# Patient Record
Sex: Male | Born: 1980 | Race: White | Hispanic: No | Marital: Married | Smoking: Never smoker
Health system: Southern US, Community
[De-identification: ages and names within clinical notes are randomized; demographics above are authoritative.]

## PROBLEM LIST (undated history)

## (undated) DIAGNOSIS — E669 Obesity, unspecified: Secondary | ICD-10-CM

## (undated) DIAGNOSIS — Z860100 Personal history of colon polyps, unspecified: Secondary | ICD-10-CM

## (undated) DIAGNOSIS — K769 Liver disease, unspecified: Secondary | ICD-10-CM

## (undated) DIAGNOSIS — D649 Anemia, unspecified: Secondary | ICD-10-CM

## (undated) DIAGNOSIS — Z8371 Family history of colonic polyps: Secondary | ICD-10-CM

## (undated) DIAGNOSIS — Z83719 Family history of colon polyps, unspecified: Secondary | ICD-10-CM

## (undated) DIAGNOSIS — J45991 Cough variant asthma: Secondary | ICD-10-CM

## (undated) HISTORY — DX: Personal history of colon polyps, unspecified: Z86.0100

## (undated) HISTORY — PX: APPENDECTOMY: SHX54

## (undated) HISTORY — DX: Family history of colon polyps, unspecified: Z83.719

## (undated) HISTORY — DX: Liver disease, unspecified: K76.9

## (undated) HISTORY — PX: HERNIA REPAIR: SHX51

## (undated) HISTORY — DX: Anemia, unspecified: D64.9

## (undated) HISTORY — DX: Personal history of colonic polyps: Z86.010

## (undated) HISTORY — DX: Family history of colonic polyps: Z83.71

---

## 2014-05-02 ENCOUNTER — Ambulatory Visit: Payer: Self-pay | Admitting: Surgery

## 2015-04-12 NOTE — Op Note (Signed)
PATIENT NAME:  Charles Jensen, Charles Jensen MR#:  [id number] DATE OF BIRTH:  [birth year]  DATE OF PROCEDURE:  05/02/2014  PREOPERATIVE DIAGNOSIS:  Ventral hernia.   POSTOPERATIVE DIAGNOSIS:  Ventral hernia.   PROCEDURE:  Ventral hernia repair.   SURGEON:  Loreli Dollar, M.D.   ANESTHESIA:  General.   INDICATIONS:  This 34 year old male has a history of bulging in the epigastrium. A ventral hernia was demonstrated and repair was recommended for definitive treatment.   DESCRIPTION OF PROCEDURE:  The patient was placed on the operating table in the supine position under general anesthesia. The abdomen was prepared with ChloraPrep and draped in a sterile manner.   A longitudinally-oriented incision was made in the epigastrium approximately 3 cm in length so that the lower end of the incision was just above the umbilicus. The incision was carried down through a thin layer of subcutaneous tissues to encounter incarcerated hernia consisting of fatty tissue. The sac was dissected free from the surrounding structures and then dissected away from a fascial ring defect. The fatty tissue within the hernia was incarcerated. It was necessary to lengthen the opening in the fascia in a cephalad direction by approximately 8 mm. With some additional dissection and manipulation, the fatty tissues were reduced back into the peritoneal cavity. The fascial edges were identified and properitoneal fat was pushed away from the fascia circumferentially around the defect. Next, a Bard soft mesh was cut to create an oval shape of some 2 x 3 cm and this was placed into the properitoneal plane and sutured to the overlying fascia above and below the defect and also on each side with through-and-through 0 Surgilon sutures so that the spread the mesh out beneath the defect. Next, the defect was closed with a longitudinally-oriented suture line of interrupted 0 Surgilon figure-of-eight sutures incorporating each suture into the mesh. The  repair looked good. It is noted that during the course of the procedure, a few small bleeding points were cauterized. Hemostasis was subsequently intact. Next, the subcutaneous tissues were approximated with 4-0 Monocryl. The skin was closed with a running 4-0 Monocryl subcuticular suture and Dermabond. The patient tolerated surgery satisfactorily and was then prepared for transfer to the recovery room.  ____________________________ Lenna Sciara. Rochel Brome, MD [email or web address] D: 05/02/2014 11:27:10 ET T: 05/02/2014 11:48:52 ET JOB#: [id number]  cc: Loreli Dollar, MD, <Dictator> Loreli Dollar MD ELECTRONICALLY SIGNED 05/06/2014 21:07

## 2016-02-18 ENCOUNTER — Encounter: Payer: Self-pay | Admitting: Emergency Medicine

## 2016-02-18 ENCOUNTER — Observation Stay
Admission: EM | Admit: 2016-02-18 | Discharge: 2016-02-20 | Disposition: A | Discharge status: Home / Self Care | Payer: 59 | Attending: Specialist | Admitting: Specialist

## 2016-02-18 DIAGNOSIS — J45991 Cough variant asthma: Secondary | ICD-10-CM | POA: Diagnosis present

## 2016-02-18 DIAGNOSIS — Z9049 Acquired absence of other specified parts of digestive tract: Secondary | ICD-10-CM | POA: Insufficient documentation

## 2016-02-18 DIAGNOSIS — R0602 Shortness of breath: Secondary | ICD-10-CM | POA: Insufficient documentation

## 2016-02-18 DIAGNOSIS — Z791 Long term (current) use of non-steroidal anti-inflammatories (NSAID): Secondary | ICD-10-CM | POA: Insufficient documentation

## 2016-02-18 DIAGNOSIS — Z8249 Family history of ischemic heart disease and other diseases of the circulatory system: Secondary | ICD-10-CM | POA: Insufficient documentation

## 2016-02-18 DIAGNOSIS — D508 Other iron deficiency anemias: Secondary | ICD-10-CM

## 2016-02-18 DIAGNOSIS — D649 Anemia, unspecified: Secondary | ICD-10-CM | POA: Diagnosis present

## 2016-02-18 DIAGNOSIS — J45909 Unspecified asthma, uncomplicated: Secondary | ICD-10-CM | POA: Insufficient documentation

## 2016-02-18 DIAGNOSIS — R42 Dizziness and giddiness: Secondary | ICD-10-CM | POA: Diagnosis not present

## 2016-02-18 DIAGNOSIS — K769 Liver disease, unspecified: Secondary | ICD-10-CM | POA: Diagnosis not present

## 2016-02-18 DIAGNOSIS — R918 Other nonspecific abnormal finding of lung field: Secondary | ICD-10-CM | POA: Insufficient documentation

## 2016-02-18 DIAGNOSIS — R05 Cough: Secondary | ICD-10-CM | POA: Insufficient documentation

## 2016-02-18 DIAGNOSIS — Z79899 Other long term (current) drug therapy: Secondary | ICD-10-CM | POA: Insufficient documentation

## 2016-02-18 DIAGNOSIS — E669 Obesity, unspecified: Secondary | ICD-10-CM | POA: Insufficient documentation

## 2016-02-18 DIAGNOSIS — Z9889 Other specified postprocedural states: Secondary | ICD-10-CM | POA: Diagnosis not present

## 2016-02-18 DIAGNOSIS — R053 Chronic cough: Secondary | ICD-10-CM

## 2016-02-18 DIAGNOSIS — Z6832 Body mass index (BMI) 32.0-32.9, adult: Secondary | ICD-10-CM | POA: Diagnosis not present

## 2016-02-18 DIAGNOSIS — K219 Gastro-esophageal reflux disease without esophagitis: Secondary | ICD-10-CM | POA: Diagnosis not present

## 2016-02-18 DIAGNOSIS — R160 Hepatomegaly, not elsewhere classified: Secondary | ICD-10-CM

## 2016-02-18 DIAGNOSIS — D1803 Hemangioma of intra-abdominal structures: Secondary | ICD-10-CM | POA: Diagnosis not present

## 2016-02-18 DIAGNOSIS — D509 Iron deficiency anemia, unspecified: Principal | ICD-10-CM | POA: Insufficient documentation

## 2016-02-18 HISTORY — DX: Cough variant asthma: J45.991

## 2016-02-18 HISTORY — DX: Obesity, unspecified: E66.9

## 2016-02-18 LAB — COMPREHENSIVE METABOLIC PANEL
ALT: 13 U/L — AB (ref 17–63)
AST: 16 U/L (ref 15–41)
Albumin: 4.3 g/dL (ref 3.5–5.0)
Alkaline Phosphatase: 52 U/L (ref 38–126)
Anion gap: 4 — ABNORMAL LOW (ref 5–15)
BILIRUBIN TOTAL: 0.8 mg/dL (ref 0.3–1.2)
BUN: 10 mg/dL (ref 6–20)
CALCIUM: 8.5 mg/dL — AB (ref 8.9–10.3)
CHLORIDE: 108 mmol/L (ref 101–111)
CO2: 25 mmol/L (ref 22–32)
CREATININE: 0.72 mg/dL (ref 0.61–1.24)
Glucose, Bld: 93 mg/dL (ref 65–99)
Potassium: 3.8 mmol/L (ref 3.5–5.1)
Sodium: 137 mmol/L (ref 135–145)
TOTAL PROTEIN: 7.1 g/dL (ref 6.5–8.1)

## 2016-02-18 LAB — CBC
HCT: 22.9 % — ABNORMAL LOW (ref 40.0–52.0)
Hemoglobin: 5.9 g/dL — ABNORMAL LOW (ref 13.0–18.0)
MCH: 13.5 pg — ABNORMAL LOW (ref 26.0–34.0)
MCHC: 25.6 g/dL — ABNORMAL LOW (ref 32.0–36.0)
MCV: 52.5 fL — AB (ref 80.0–100.0)
PLATELETS: 250 10*3/uL (ref 150–440)
RBC: 4.37 MIL/uL — AB (ref 4.40–5.90)
RDW: 23.8 % — AB (ref 11.5–14.5)
WBC: 6.4 10*3/uL (ref 3.8–10.6)

## 2016-02-18 LAB — FERRITIN: FERRITIN: 2 ng/mL — AB (ref 24–336)

## 2016-02-18 LAB — IRON AND TIBC
Iron: 10 ug/dL — ABNORMAL LOW (ref 45–182)
Saturation Ratios: 2 % — ABNORMAL LOW (ref 17.9–39.5)
TIBC: 536 ug/dL — AB (ref 250–450)
UIBC: 526 ug/dL

## 2016-02-18 LAB — PREPARE RBC (CROSSMATCH)

## 2016-02-18 LAB — RETICULOCYTES
RBC.: 4.39 MIL/uL — ABNORMAL LOW (ref 4.40–5.90)
RETIC COUNT ABSOLUTE: 109.8 10*3/uL (ref 19.0–183.0)
RETIC CT PCT: 2.5 % (ref 0.4–3.1)

## 2016-02-18 LAB — LACTATE DEHYDROGENASE: LDH: 116 U/L (ref 98–192)

## 2016-02-18 LAB — ABO/RH: ABO/RH(D): A POS

## 2016-02-18 MED ORDER — SODIUM CHLORIDE 0.9 % IV SOLN
10.0000 mL/h | Freq: Once | INTRAVENOUS | Status: AC
  Administered 2016-02-18: 10 mL/h via INTRAVENOUS

## 2016-02-18 MED ORDER — PREDNISONE 20 MG PO TABS
40.0000 mg | ORAL_TABLET | Freq: Once | ORAL | Status: AC
  Administered 2016-02-18: 40 mg via ORAL
  Filled 2016-02-18: qty 2

## 2016-02-18 NOTE — ED Notes (Addendum)
Unable to obtain type and screen at this time. Multiple attempts made.

## 2016-02-18 NOTE — ED Provider Notes (Signed)
[facility] Emergency Department Provider Note   ____________________________________________  Time seen: Approximately 7 PM   I have reviewed the triage vital signs and the triage nursing note.  HISTORY  Chief Complaint Abnormal Lab and Shortness of Breath   Historian Patient  HPI Charles Jensen is a 35 y.o. male who has a history of cough variant asthma, and reports he seen a pulmonologist for this in the past, and that albuterol does not work for him, and he usually needs that a course of prednisone when he gets to this point. He reports that he went out [place] and started developing a bit of a wheeze/cough and then developed a cold when he got back and it's been about 2 weeks. He does take Symbicort inhaler. He went to the urgent care to be evaluated for this cough for 2 weeks. He reports some vague nonspecific mid back pain. He reports that his chest x-ray was reportedly negative. He had blood work drawn that found anemia to 5.6.      No past medical history on file.  There are no active problems to display for this patient.   No past surgical history on file.  Current Outpatient Rx  Name  Route  Sig  Dispense  Refill  . budesonide-formoterol (SYMBICORT) 160-4.5 MCG/ACT inhaler   Inhalation   Inhale 2 puffs into the lungs 2 (two) times daily.         Marland Kitchen ibuprofen (ADVIL,MOTRIN) 200 MG tablet   Oral   Take 600 mg by mouth every 6 (six) hours as needed for headache or mild pain.           Allergies Review of patient's allergies indicates no known allergies.  History reviewed. No pertinent family history.  Social History Social History  Substance Use Topics  . Smoking status: Never Smoker   . Smokeless tobacco: None  . Alcohol Use: None    Review of Systems  Constitutional: Negative for fever. Eyes: Negative for visual changes. ENT: Negative for sore throat. Cardiovascular: Negative for chest pain. Respiratory: Negative for  shortness of breath. Positive for intermittent cough episodes. No pleuritic chest pain. Gastrointestinal: Negative for abdominal pain, vomiting and diarrhea. Genitourinary: Negative for dysuria. Musculoskeletal: Negative for back pain. Skin: Negative for rash. Neurological: Negative for headache. 10 point Review of Systems otherwise negative ____________________________________________   PHYSICAL EXAM:  VITAL SIGNS: ED Triage Vitals  Enc Vitals Group     BP 02/18/16 1732 164/67 mmHg     Pulse Rate 02/18/16 1732 112     Resp 02/18/16 1732 18     Temp 02/18/16 1732 98.9 F (37.2 C)     Temp Source 02/18/16 1732 Oral     SpO2 02/18/16 1732 97 %     Weight 02/18/16 1732 250 lb (113.399 kg)     Height 02/18/16 1732 6\' 1"  (1.854 m)     Head Cir --      Peak Flow --      Pain Score --      Pain Loc --      Pain Edu? --      Excl. in Foley? --      Constitutional: Alert and oriented. Well appearing and in no distress. HEENT   Head: Normocephalic and atraumatic.      Eyes: Conjunctivae are normal. PERRL. Normal extraocular movements.      Ears:         Nose: No congestion/rhinnorhea.   Mouth/Throat: Mucous membranes are  moist.   Neck: No stridor. Cardiovascular/Chest: Tachycardic, regular.  No murmurs, rubs, or gallops. Respiratory: Normal respiratory effort without tachypnea nor retractions. Breath sounds are clear and equal bilaterally. No wheezes/rales/rhonchi. Gastrointestinal: Soft. No distention, no guarding, no rebound. Nontender.    Genitourinary/rectal:Deferred.  Stool sample viewed, brown.  Heme negative Musculoskeletal: Nontender with normal range of motion in all extremities. No joint effusions.  No lower extremity tenderness.  No edema. Neurologic:  Normal speech and language. No gross or focal neurologic deficits are appreciated. Skin:  Skin is warm, dry and intact. No rash noted. Psychiatric: Mood and affect are normal. Speech and behavior are normal.  Patient exhibits appropriate insight and judgment.  ____________________________________________   EKG I, Lisa Roca, MD, the attending physician have personally viewed and interpreted all ECGs.  104 bpm. Sinus tachycardia. Nonspecific intraventricular conduction delay. Normal axis. Normal ST and T-wave ____________________________________________  LABS (pertinent positives/negatives)  Comprehensive metabolic panel significant abnormalities White blood count 6.4, hemoglobin 5.9 and platelet count 250  ____________________________________________  RADIOLOGY All Xrays were viewed by me. Imaging interpreted by Radiologist.  None __________________________________________  PROCEDURES  Procedure(s) performed: None  Critical Care performed: None  ____________________________________________   ED COURSE / ASSESSMENT AND PLAN  Pertinent labs & imaging results that were available during my care of the patient were reviewed by me and considered in my medical decision making (see chart for details).   Patient reports chest x-ray normal from urgent care, I don't have access to this. His O2 sat is 100% and his lungs are essentially clear except for when he has a bronchitic/asthmatic type cough. Patient reports that albuterol does not work for him and he does not want a trach albuterol. He is requesting prednisone as this has worked for his asthmatic exacerbations in the past. I will give him a dose prednisone.  He was sent here due to anemia. I discussed this with the on-call hematologist, Dr. Rogue Bussing, recommends admission and 2 units prbc.  Stool brown and heme negative.  Denies shortness of breath or chest pain. He does have a resting tachycardia between 101 18. He states that home and his resting heart rate is in the 80s.    CONSULTATIONS:   Hematologist in consultation, and hospitalist for admission.  Patient / Family / Caregiver informed of clinical course, medical  decision-making process, and agree with plan.     ___________________________________________   FINAL CLINICAL IMPRESSION(S) / ED DIAGNOSES   Final diagnoses:  Symptomatic anemia  Cough variant asthma              Note: This dictation was prepared with Dragon dictation. Any transcriptional errors that result from this process are unintentional   Lisa Roca, MD 02/18/16 [phone]

## 2016-02-18 NOTE — ED Notes (Addendum)
Pt sent over from PCP for anemia.  hgb 5.7; hct 23.3  Pt also reports chronic asthmatic cough. States he has had cough for about 4 weeks and is unable to get relief with inhaler.

## 2016-02-18 NOTE — H&P (Signed)
[place] at [place] NAME: Charles Jensen    MR#:  [id number]  DATE OF BIRTH:  27-Apr-1981  DATE OF ADMISSION:  02/18/2016  PRIMARY CARE PHYSICIAN: No PCP Per Patient   REQUESTING/REFERRING PHYSICIAN: Reita Cliche, MD  CHIEF COMPLAINT:   Chief Complaint  Patient presents with  . Abnormal Lab  . Shortness of Breath    HISTORY OF PRESENT ILLNESS:  Charles Jensen  is a 35 y.o. male who presents with symptomatic anemia. Patient states that for the past month or so he's noticed sometimes when he would be lightheaded, sometimes when he had some fast heart rate or poor exertional tolerance some dyspnea. He went to an outpatient clinic today for mild asthma symptoms. CBC drawn there showed a hemoglobin of 5.9. He came to the ED for further evaluation and treatment. Hematologist contacted by ED physician recommended admission for observation and transfusion.  PAST MEDICAL HISTORY:   Past Medical History  Diagnosis Date  . Cough variant asthma   . Obesity     PAST SURGICAL HISTORY:   Past Surgical History  Procedure Laterality Date  . Appendectomy      SOCIAL HISTORY:   Social History  Substance Use Topics  . Smoking status: Never Smoker   . Smokeless tobacco: Not on file  . Alcohol Use: No    FAMILY HISTORY:   Family History  Problem Relation Age of Onset  . Heart attack Mother   . CAD Father   . Hypertension Father     DRUG ALLERGIES:  No Known Allergies  MEDICATIONS AT HOME:   Prior to Admission medications   Medication Sig Start Date End Date Taking? Authorizing Provider  budesonide-formoterol (SYMBICORT) 160-4.5 MCG/ACT inhaler Inhale 2 puffs into the lungs 2 (two) times daily.   Yes Historical Provider, MD  ibuprofen (ADVIL,MOTRIN) 200 MG tablet Take 600 mg by mouth every 6 (six) hours as needed for headache or mild pain.   Yes Historical Provider, MD    REVIEW OF SYSTEMS:  Review of Systems  Constitutional:  Negative for fever, chills, weight loss and malaise/fatigue.  HENT: Negative for ear pain, hearing loss and tinnitus.   Eyes: Negative for blurred vision, double vision, pain and redness.  Respiratory: Positive for shortness of breath. Negative for cough and hemoptysis.   Cardiovascular: Negative for chest pain, palpitations, orthopnea and leg swelling.  Gastrointestinal: Negative for nausea, vomiting, abdominal pain, diarrhea and constipation.  Genitourinary: Negative for dysuria, frequency and hematuria.  Musculoskeletal: Negative for back pain, joint pain and neck pain.  Skin:       No acne, rash, or lesions  Neurological: Positive for weakness. Negative for dizziness, tremors and focal weakness.  Endo/Heme/Allergies: Negative for polydipsia. Does not bruise/bleed easily.  Psychiatric/Behavioral: Negative for depression. The patient is not nervous/anxious and does not have insomnia.      VITAL SIGNS:   Filed Vitals:   02/18/16 2129 02/18/16 2143 02/18/16 2200 02/18/16 2205  BP:  131/81 132/73 128/80  Pulse:  117 107 108  Temp: 99.6 F (37.6 C) 99.6 F (37.6 C)  99.5 F (37.5 C)  TempSrc: Oral Oral  Oral  Resp:  16 20 18   Height:      Weight:      SpO2:  100% 100% 99%   Wt Readings from Last 3 Encounters:  02/18/16 113.399 kg (250 lb)    PHYSICAL EXAMINATION:  Physical Exam  Vitals reviewed. Constitutional: He is oriented to person, place,  and time. He appears well-developed and well-nourished. No distress.  HENT:  Head: Normocephalic and atraumatic.  Mouth/Throat: Oropharynx is clear and moist.  Eyes: Conjunctivae and EOM are normal. Pupils are equal, round, and reactive to light. No scleral icterus.  Neck: Normal range of motion. Neck supple. No JVD present. No thyromegaly present.  Cardiovascular: Normal rate, regular rhythm and intact distal pulses.  Exam reveals no gallop and no friction rub.   No murmur heard. Respiratory: Effort normal and breath sounds normal.  No respiratory distress. He has no wheezes. He has no rales.  GI: Soft. Bowel sounds are normal. He exhibits no distension. There is no tenderness.  Musculoskeletal: Normal range of motion. He exhibits no edema.  No arthritis, no gout  Lymphadenopathy:    He has no cervical adenopathy.  Neurological: He is alert and oriented to person, place, and time. No cranial nerve deficit.  No dysarthria, no aphasia  Skin: Skin is warm and dry. No rash noted. No erythema.  Psychiatric: He has a normal mood and affect. His behavior is normal. Judgment and thought content normal.    LABORATORY PANEL:   CBC  Recent Labs Lab 02/18/16 1740  WBC 6.4  HGB 5.9*  HCT 22.9*  PLT 250   ------------------------------------------------------------------------------------------------------------------  Chemistries   Recent Labs Lab 02/18/16 1740  NA 137  K 3.8  CL 108  CO2 25  GLUCOSE 93  BUN 10  CREATININE 0.72  CALCIUM 8.5*  AST 16  ALT 13*  ALKPHOS 52  BILITOT 0.8   ------------------------------------------------------------------------------------------------------------------  Cardiac Enzymes No results for input(s): TROPONINI in the last 168 hours. ------------------------------------------------------------------------------------------------------------------  RADIOLOGY:  No results found.  EKG:   Orders placed or performed during the hospital encounter of 02/18/16  . EKG 12-Lead  . EKG 12-Lead    IMPRESSION AND PLAN:  Principal Problem:   Symptomatic anemia - transfused 2 units tonight. Iron studies show what is likely iron deficiency anemia. Hematology consult. Active Problems:   Cough variant asthma - continue home meds  All the records are reviewed and case discussed with ED provider. Management plans discussed with the patient and/or family.  DVT PROPHYLAXIS: Mechanical only  GI PROPHYLAXIS: None  ADMISSION STATUS: Observation  CODE STATUS: Full Code  Status History    This patient does not have a recorded code status. Please follow your organizational policy for patients in this situation.      TOTAL TIME TAKING CARE OF THIS PATIENT: 40 minutes.    Dnasia Gauna Delleker 02/18/2016, 10:42 PM  [facility]  Office  [phone]  CC: Primary care physician; No PCP Per Patient

## 2016-02-19 ENCOUNTER — Encounter: Payer: Self-pay | Admitting: *Deleted

## 2016-02-19 ENCOUNTER — Observation Stay: Payer: 59

## 2016-02-19 DIAGNOSIS — J45909 Unspecified asthma, uncomplicated: Secondary | ICD-10-CM | POA: Diagnosis not present

## 2016-02-19 DIAGNOSIS — R0602 Shortness of breath: Secondary | ICD-10-CM | POA: Diagnosis not present

## 2016-02-19 DIAGNOSIS — K769 Liver disease, unspecified: Secondary | ICD-10-CM | POA: Diagnosis not present

## 2016-02-19 DIAGNOSIS — R42 Dizziness and giddiness: Secondary | ICD-10-CM

## 2016-02-19 DIAGNOSIS — D509 Iron deficiency anemia, unspecified: Secondary | ICD-10-CM

## 2016-02-19 LAB — CBC
HEMATOCRIT: 27.4 % — AB (ref 40.0–52.0)
Hemoglobin: 7.6 g/dL — ABNORMAL LOW (ref 13.0–18.0)
MCH: 15.6 pg — ABNORMAL LOW (ref 26.0–34.0)
MCHC: 27.7 g/dL — ABNORMAL LOW (ref 32.0–36.0)
MCV: 56.2 fL — AB (ref 80.0–100.0)
Platelets: 281 10*3/uL (ref 150–440)
RBC: 4.89 MIL/uL (ref 4.40–5.90)
RDW: 29.8 % — AB (ref 11.5–14.5)
WBC: 7.8 10*3/uL (ref 3.8–10.6)

## 2016-02-19 LAB — URINALYSIS COMPLETE WITH MICROSCOPIC (ARMC ONLY)
BACTERIA UA: NONE SEEN
Bilirubin Urine: NEGATIVE
GLUCOSE, UA: NEGATIVE mg/dL
HGB URINE DIPSTICK: NEGATIVE
Ketones, ur: NEGATIVE mg/dL
LEUKOCYTES UA: NEGATIVE
Nitrite: NEGATIVE
PH: 6 (ref 5.0–8.0)
PROTEIN: NEGATIVE mg/dL
RBC / HPF: NONE SEEN RBC/hpf (ref 0–5)
SQUAMOUS EPITHELIAL / LPF: NONE SEEN
Specific Gravity, Urine: 1.011 (ref 1.005–1.030)

## 2016-02-19 LAB — BASIC METABOLIC PANEL
Anion gap: 6 (ref 5–15)
BUN: 11 mg/dL (ref 6–20)
CHLORIDE: 109 mmol/L (ref 101–111)
CO2: 24 mmol/L (ref 22–32)
Calcium: 8.8 mg/dL — ABNORMAL LOW (ref 8.9–10.3)
Creatinine, Ser: 0.71 mg/dL (ref 0.61–1.24)
GFR calc Af Amer: >60 mL/min (ref >60)
GFR calc non Af Amer: >60 mL/min (ref >60)
Glucose, Bld: 126 mg/dL — ABNORMAL HIGH (ref 65–99)
POTASSIUM: 4.5 mmol/L (ref 3.5–5.1)
SODIUM: 139 mmol/L (ref 135–145)

## 2016-02-19 LAB — TYPE AND SCREEN
ABO/RH(D): A POS
Antibody Screen: NEGATIVE
UNIT DIVISION: 0
UNIT DIVISION: 0

## 2016-02-19 LAB — TSH: TSH: 1.671 u[IU]/mL (ref 0.350–4.500)

## 2016-02-19 MED ORDER — PREDNISONE 10 MG PO TABS: ORAL_TABLET | ORAL | Status: DC

## 2016-02-19 MED ORDER — LEVOFLOXACIN 750 MG PO TABS
750.0000 mg | ORAL_TABLET | Freq: Every day | ORAL | Status: DC
Start: 2016-02-19 — End: 2016-02-27

## 2016-02-19 MED ORDER — SODIUM CHLORIDE 0.9 % IV SOLN
510.0000 mg | Freq: Once | INTRAVENOUS | Status: AC
  Administered 2016-02-19: 09:00:00 510 mg via INTRAVENOUS
  Filled 2016-02-19: qty 17

## 2016-02-19 MED ORDER — GADOBENATE DIMEGLUMINE 529 MG/ML IV SOLN
20.0000 mL | Freq: Once | INTRAVENOUS | Status: AC | PRN
  Administered 2016-02-19: 20 mL via INTRAVENOUS

## 2016-02-19 MED ORDER — MOMETASONE FURO-FORMOTEROL FUM 200-5 MCG/ACT IN AERO
2.0000 | INHALATION_SPRAY | Freq: Two times a day (BID) | RESPIRATORY_TRACT | Status: DC
  Administered 2016-02-19 – 2016-02-20 (×3): 2 via RESPIRATORY_TRACT
  Filled 2016-02-19: qty 8.8

## 2016-02-19 MED ORDER — PREDNISONE 10 MG (21) PO TBPK: 20.0000 mg | ORAL_TABLET | Freq: Every evening | ORAL | Status: DC

## 2016-02-19 MED ORDER — BUDESONIDE-FORMOTEROL FUMARATE 160-4.5 MCG/ACT IN AERO: 2.0000 | INHALATION_SPRAY | Freq: Two times a day (BID) | RESPIRATORY_TRACT | Status: DC

## 2016-02-19 MED ORDER — ONDANSETRON HCL 4 MG/2ML IJ SOLN: 4.0000 mg | Freq: Four times a day (QID) | INTRAMUSCULAR | Status: DC | PRN

## 2016-02-19 MED ORDER — PREDNISONE 10 MG (21) PO TBPK: 10.0000 mg | ORAL_TABLET | Freq: Three times a day (TID) | ORAL | Status: DC

## 2016-02-19 MED ORDER — ONDANSETRON HCL 4 MG PO TABS: 4.0000 mg | ORAL_TABLET | Freq: Four times a day (QID) | ORAL | Status: DC | PRN

## 2016-02-19 MED ORDER — FERROUS SULFATE 325 (65 FE) MG PO TABS: 325.0000 mg | ORAL_TABLET | Freq: Two times a day (BID) | ORAL | Status: AC

## 2016-02-19 MED ORDER — ZOLPIDEM TARTRATE 5 MG PO TABS
5.0000 mg | ORAL_TABLET | Freq: Once | ORAL | Status: AC
  Administered 2016-02-19: 22:00:00 5 mg via ORAL
  Filled 2016-02-19: qty 1

## 2016-02-19 MED ORDER — ACETAMINOPHEN 650 MG RE SUPP: 650.0000 mg | Freq: Four times a day (QID) | RECTAL | Status: DC | PRN

## 2016-02-19 MED ORDER — PREDNISONE 10 MG (21) PO TBPK
10.0000 mg | ORAL_TABLET | ORAL | Status: AC
  Administered 2016-02-19: 10 mg via ORAL

## 2016-02-19 MED ORDER — PREDNISONE 10 MG (21) PO TBPK: 10.0000 mg | ORAL_TABLET | Freq: Four times a day (QID) | ORAL | Status: DC

## 2016-02-19 MED ORDER — PREDNISONE 10 MG (21) PO TBPK
20.0000 mg | ORAL_TABLET | Freq: Every morning | ORAL | Status: AC
  Administered 2016-02-19: 22:00:00 20 mg via ORAL
  Filled 2016-02-19: qty 21

## 2016-02-19 MED ORDER — PREDNISONE 10 MG (21) PO TBPK
10.0000 mg | ORAL_TABLET | ORAL | Status: AC
  Administered 2016-02-19: 22:00:00 10 mg via ORAL

## 2016-02-19 MED ORDER — LEVOFLOXACIN 750 MG PO TABS
750.0000 mg | ORAL_TABLET | Freq: Every day | ORAL | Status: DC
  Administered 2016-02-19 – 2016-02-20 (×2): 750 mg via ORAL
  Filled 2016-02-19 (×2): qty 1

## 2016-02-19 MED ORDER — INFLUENZA VAC SPLIT QUAD 0.5 ML IM SUSY: 0.5000 mL | PREFILLED_SYRINGE | INTRAMUSCULAR | Status: DC

## 2016-02-19 MED ORDER — PREDNISONE 10 MG (21) PO TBPK
20.0000 mg | ORAL_TABLET | Freq: Every evening | ORAL | Status: AC
  Administered 2016-02-19: 22:00:00 20 mg via ORAL

## 2016-02-19 MED ORDER — ACETAMINOPHEN 325 MG PO TABS
650.0000 mg | ORAL_TABLET | Freq: Four times a day (QID) | ORAL | Status: DC | PRN
  Administered 2016-02-19: 650 mg via ORAL
  Filled 2016-02-19: qty 2

## 2016-02-19 MED ORDER — GUAIFENESIN 100 MG/5ML PO SOLN
5.0000 mL | ORAL | Status: DC | PRN
  Administered 2016-02-19: 100 mg via ORAL
  Filled 2016-02-19: qty 10

## 2016-02-19 NOTE — Progress Notes (Signed)
[place] at [place] NAME: Charles Jensen    MR#:  [id number]  DATE OF BIRTH:  [birth year]  SUBJECTIVE:   Pt. Here due to shortness of breath and noted to be anemic. S/p blood transfusion and Hg has improved.  Noted to have a cough which is chronic in nature.  No other complaints.  Wife at bedside.   REVIEW OF SYSTEMS:    Review of Systems  Constitutional: Negative for fever and chills.  HENT: Negative for congestion and tinnitus.   Eyes: Negative for blurred vision and double vision.  Respiratory: Positive for cough. Negative for shortness of breath and wheezing.   Cardiovascular: Negative for chest pain, orthopnea and PND.  Gastrointestinal: Negative for nausea, vomiting, abdominal pain and diarrhea.  Genitourinary: Negative for dysuria and hematuria.  Neurological: Negative for dizziness, sensory change and focal weakness.  All other systems reviewed and are negative.   Nutrition: Regular Tolerating Diet: Yes Tolerating PT: Ambulatory.      DRUG ALLERGIES:  No Known Allergies  VITALS:  Blood pressure 137/72, pulse 95, temperature 99.1 F (37.3 C), temperature source Oral, resp. rate 24, height 6\' 1"  (1.854 m), weight 113.399 kg (250 lb), SpO2 100 %.  PHYSICAL EXAMINATION:   Physical Exam  GENERAL:  35 y.o.-year-old pale appearing patient lying in the bed with no acute distress.  EYES: Pupils equal, round, reactive to light and accommodation. No scleral icterus. Extraocular muscles intact.  HEENT: Head atraumatic, normocephalic. Oropharynx and nasopharynx clear.  NECK:  Supple, no jugular venous distention. No thyroid enlargement, no tenderness.  LUNGS: Normal breath sounds bilaterally, no wheezing, rales, rhonchi. No use of accessory muscles of respiration.  CARDIOVASCULAR: S1, S2 normal. No murmurs, rubs, or gallops.  ABDOMEN: Soft, nontender, nondistended. Bowel sounds present. No organomegaly or mass.   EXTREMITIES: No cyanosis, clubbing or edema b/l.    NEUROLOGIC: Cranial nerves II through XII are intact. No focal Motor or sensory deficits b/l.   PSYCHIATRIC: The patient is alert and oriented x 3.  SKIN: No obvious rash, lesion, or ulcer.    LABORATORY PANEL:   CBC  Recent Labs Lab 02/19/16 0428  WBC 7.8  HGB 7.6*  HCT 27.4*  PLT 281   ------------------------------------------------------------------------------------------------------------------  Chemistries   Recent Labs Lab 02/18/16 1740 02/19/16 0428  NA 137 139  K 3.8 4.5  CL 108 109  CO2 25 24  GLUCOSE 93 126*  BUN 10 11  CREATININE 0.72 0.71  CALCIUM 8.5* 8.8*  AST 16  --   ALT 13*  --   ALKPHOS 52  --   BILITOT 0.8  --    ------------------------------------------------------------------------------------------------------------------  Cardiac Enzymes No results for input(s): TROPONINI in the last 168 hours. ------------------------------------------------------------------------------------------------------------------  RADIOLOGY:  Ct Chest Wo Contrast  02/19/2016  CLINICAL DATA:  Cough x4 weeks EXAM: CT CHEST WITHOUT CONTRAST TECHNIQUE: Multidetector CT imaging of the chest was performed following the standard protocol without IV contrast. COMPARISON:  None. FINDINGS: Mediastinum/Lymph Nodes: No masses or pathologically enlarged lymph nodes identified on this un-enhanced exam. No pericardial effusion. Lungs/Pleura: Patchy airspace opacities in the superior and posterior basal segments left lower lobe. Right lung clear. No pleural effusion. Upper abdomen: 11.1 x 8 cm low-attenuation lobular partially exophytic mass in hepatic segment 6, incompletely visualized. 5.6 x 3.8 cm low attenuation partially exophytic mass from hepatic segment 3. Musculoskeletal: No chest wall mass or suspicious bone lesions identified. Minimal spurring in the lower thoracic spine. IMPRESSION: 1.  Patchy left lower lobe airspace  opacities suggesting pneumonia. 2. 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 Recommend MR liver with contrast for further possibly definitive characterization. These results will be called to the ordering clinician or representative by the Radiologist Assistant, and communication documented in the PACS or zVision Dashboard. Electronically Signed   By: Lucrezia Europe M.D.   On: 02/19/2016 14:01   Mr Liver W Wo Contrast  02/19/2016  ADDENDUM REPORT: 02/19/2016 17:43 ADDENDUM: In speaking with Dr. Verdell Carmine patient has laboratory values inconsistent hemochromatosis. Liver masses are still indeterminate and warrant biopsy. Findings conveyed toVIVEK Ephriam Turman on 02/19/2016  at17:41. Electronically Signed   By: Suzy Bouchard M.D.   On: 02/19/2016 17:43  02/19/2016  CLINICAL DATA:  Anemia, liver lesion. EXAM: MRI ABDOMEN WITHOUT AND WITH CONTRAST TECHNIQUE: Multiplanar multisequence MR imaging of the abdomen was performed both before and after the administration of intravenous contrast. Apparently contrast was injected at the beginning of scan therefore non noncontrast imaging is performed. CONTRAST:  17mL MULTIHANCE GADOBENATE DIMEGLUMINE 529 MG/ML IV SOLN -- COMPARISON:  CT 02/19/2016 FINDINGS: Lower chest:  Lung bases are clear. Hepatobiliary: There is a thick-walled lobular mass within the RIGHT hepatic lobe measuring 11.2 by 9.7 cm. This thick-walled lesion has a presumed thickened enhancing rim. No precontrast imaging was performed therefore enhancement cannot be characterized. Centrally within the lesion there is high signal intensity on T2 weighted imaging suggesting necrosis / fluid. Similar lesion in the LEFT hepatic lobe by without the central fluid component measuring 4.8 x 3.2 cm (image 47, series 10). On the T2 weighted imaging there is low signal intensity throughout the liver parenchyma suggesting iron overload. Pancreas: There is relatively low signal intensity with the pancreas on this  is more difficult to define. Spleen: Low signal intensity on T2 weighted imaging in the spleen (series 3) Adrenals/urinary tract: Adrenal glands and kidneys are normal. Stomach/Bowel: Stomach and limited of the small bowel is unremarkable Vascular/Lymphatic: Abdominal aortic normal caliber. No retroperitoneal periportal lymphadenopathy. Musculoskeletal: Diffuse low signal intensity within the spine on T2 weighted imaging (series 3) IMPRESSION: 1. Large mass in RIGHT hepatic lobe with thick enhancing (presumed) rim is concerning for malignancy. 2. A second lesion in the lateral LEFT hepatic lobe with presumed same etiology. 3. 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 Hemochromatosis can be associated with hepatocellular carcinoma. These results will be called to the ordering clinician or representative by the Radiologist Assistant, and communication documented in the PACS or zVision Dashboard. Electronically Signed: By: Suzy Bouchard M.D. On: 02/19/2016 17:26     ASSESSMENT AND PLAN:   35 year old male with no significant past medical history presented to the hospital due to weakness, shortness of breath and noted to be anemic.  #1 anemia-patient noted to have severe iron deficiency anemia. Etiology is unclear. -Patient has been transfused a total of 2 units of packed red blood cells and hemoglobin has improved. -Seen by oncology/oncology and they're recommending giving him IV iron which has been given. -Follow hemoglobin. Continue iron supplements.  #2 cough-this is been chronic in nature. Etiology is also unclear. -Patient has been treated for GERD, cough variant asthma without much improvement. -I will obtain a CT chest noncontrast which showed a possible pneumonia and he was started on Levaquin.   #3. Liver Mass-patient on CT chest was incidentally noted to have a liver mass. MRI of  the abdomen confirming 2  large liver masses but the etiology of which is unclear. -Unclear at this masses or related to his underlying anemia. Notified oncology about the MRI results.   All the records are reviewed and case discussed with Care Management/Social Workerr. Management plans discussed with the patient, family and they are in agreement.  CODE STATUS: Full  DVT Prophylaxis: Ambulatory  TOTAL TIME TAKING CARE OF THIS PATIENT: 35 minutes.   POSSIBLE D/C IN 1-2 DAYS, DEPENDING ON CLINICAL CONDITION.   Henreitta Leber M.D on 02/19/2016 at 5:49 PM  Between 7am to 6pm - Pager - [phone]  After 6pm go to [email or web address] - password [email or web address] [employer]  [facility]  Office  [phone]  CC: Primary care physician; No PCP Per Patient

## 2016-02-19 NOTE — Consult Note (Signed)
[facility] CONSULT NOTE  Patient Care Team: No Pcp Per Patient as PCP - General (General Practice)  CHIEF COMPLAINTS/PURPOSE OF CONSULTATION:  Anemia  HISTORY OF PRESENTING ILLNESS:  Charles Jensen 35 y.o.  male with no significant past medical history except for asthma- noted to have lightheadedness and dizziness especially positional for the last many weeks. Patient also noted worsening shortness of breath with exertion. Patient noted palpitations; and also craving for ice. Denies any chest pain.  Patient denies any blood in stools. Denies any black stools. Admits to eating meat. Denies any chronic diarrhea. Had about 10 pound weight loss which is intentional. Denies any other medical problems.  Interestingly patient states approximate 3 years ago- he had given blood/vials- at [employer] project through [employer]. Not had any blood related recently. Patient also stated that he was told to be anemic with a hemoglobin of 9; when he had his hernia repair in 2015. Patient did not have any blood transfusions.    ROS: A complete 10 point review of system is done which is negative except mentioned above in history of present illness  MEDICAL HISTORY:  Past Medical History  Diagnosis Date  . Cough variant asthma   . Obesity     SURGICAL HISTORY: Past Surgical History  Procedure Laterality Date  . Appendectomy    . Hernia repair      2015    SOCIAL HISTORY: He has a 57-year-old son. He works in [employer]. Occasional alcohol. Social History   Social History  . Marital Status: Married    Spouse Name: N/A  . Number of Children: N/A  . Years of Education: N/A   Occupational History  . Not on file.   Social History Main Topics  . Smoking status: Never Smoker   . Smokeless tobacco: Not on file  . Alcohol Use: No  . Drug Use: No  . Sexual Activity: Not on file   Other Topics Concern  . Not on file   Social History Narrative    FAMILY HISTORY: Great  aunt had stomach cancer; no immediate family with any cancers. Family History  Problem Relation Age of Onset  . Heart attack Mother   . CAD Father   . Hypertension Father     ALLERGIES:  has No Known Allergies.  MEDICATIONS:  Current Facility-Administered Medications  Medication Dose Route Frequency Provider Last Rate Last Dose  . acetaminophen (TYLENOL) tablet 650 mg  650 mg Oral Q6H PRN Lance Coon, MD       Or  . acetaminophen (TYLENOL) suppository 650 mg  650 mg Rectal Q6H PRN Lance Coon, MD      . Derrill Memo ON 02/20/2016] Influenza vac split quadrivalent PF (FLUARIX) injection 0.5 mL  0.5 mL Intramuscular Tomorrow-1000 Lance Coon, MD      . mometasone-formoterol [facility]) 200-5 MCG/ACT inhaler 2 puff  2 puff Inhalation BID Lance Coon, MD   2 puff at 02/19/16 0100  . ondansetron (ZOFRAN) tablet 4 mg  4 mg Oral Q6H PRN Lance Coon, MD       Or  . ondansetron [facility]) injection 4 mg  4 mg Intravenous Q6H PRN Lance Coon, MD          .  PHYSICAL EXAMINATION:   Filed Vitals:   02/19/16 0206 02/19/16 0653  BP: 135/71 133/69  Pulse: 105 96  Temp: 98.9 F (37.2 C) 98.5 F (36.9 C)  Resp: 18 20   Filed Weights   02/18/16 1732  Weight: 250 lb (113.399 kg)    GENERAL: Well-nourished well-developed; Alert, no distress and comfortable.   With his wife. EYES: positive for pallor OROPHARYNX: no thrush or ulceration; good dentition  NECK: supple, no masses felt LYMPH:  no palpable lymphadenopathy in the cervical, axillary or inguinal regions LUNGS: clear to auscultation and  No wheeze or crackles HEART/CVS: regular rate & rhythm and no murmurs; No lower extremity edema ABDOMEN: abdomen soft, non-tender and normal bowel sounds Musculoskeletal:no cyanosis of digits and no clubbing  PSYCH: alert & oriented x 3 with fluent speech NEURO: no focal motor/sensory deficits SKIN:  no rashes or significant lesions  LABORATORY DATA:  I have reviewed the data as listed Lab  Results  Component Value Date   WBC 7.8 02/19/2016   HGB 7.6* 02/19/2016   HCT 27.4* 02/19/2016   MCV 56.2* 02/19/2016   PLT 281 02/19/2016    Recent Labs  02/18/16 1740 02/19/16 0428  NA 137 139  K 3.8 4.5  CL 108 109  CO2 25 24  GLUCOSE 93 126*  BUN 10 11  CREATININE 0.72 0.71  CALCIUM 8.5* 8.8*  GFRNONAA >60 >60  GFRAA >60 >60  PROT 7.1  --   ALBUMIN 4.3  --   AST 16  --   ALT 13*  --   ALKPHOS 52  --   BILITOT 0.8  --     RADIOGRAPHIC STUDIES: I have personally reviewed the radiological images as listed and agreed with the findings in the report. No results found.  ASSESSMENT & PLAN:   # Severe Iron deficiency anemia: Chronic- as evident by low MCV. Status post 2 units of PRBC hemoglobin is up to 7. Recommend IV Feraheme 1 today. Recommend by mouth iron pills outpatient basis.   #  The etiology is unclear. No obvious blood loss is noted. Question malabsorption. Patient will need GI workup as an outpatient.  # Recommend follow-up in the cancer Center in approximately 1 week; my card was given. Plan discussed with the patient and wife in detail.  Thank you Dr. Verdell Carmine for allowing me to participate in the care of your pleasant patient. Please do not hesitate to contact me with questions or concerns in the interim.       Cammie Sickle, MD 02/19/2016 8:05 AM

## 2016-02-19 NOTE — Discharge Instructions (Signed)
Blood Transfusion  A blood transfusion is a procedure in which you receive donated blood through an IV tube. You may need a blood transfusion because of illness, surgery, or injury. The blood may come from a donor, or it may be your own blood that you donated previously. The blood given in a transfusion is made up of different types of cells. You may receive:  Red blood cells. These carry oxygen and replace lost blood.  Platelets. These control bleeding.  Plasma. Thishelps blood to clot. If you have hemophilia or another clotting disorder, you may also receive other types of blood products. LET [facility] CARE PROVIDER KNOW ABOUT:  Any allergies you have.  All medicines you are taking, including vitamins, herbs, eye drops, creams, and over-the-counter medicines.  Previous problems you or members of your family have had with the use of anesthetics.  Any blood disorders you have.  Previous surgeries you have had.  Any medical conditions you may have.  Any previous reactions you have had during a blood transfusion.  RISKS AND COMPLICATIONS Generally, this is a safe procedure. However, problems may occur, including:  Having an allergic reaction to something in the donated blood.  Fever. This may be a reaction to the white blood cells in the transfused blood.  Iron overload. This can happen from having many transfusions.  Transfusion-related acute lung injury (TRALI). This is a rare reaction that causes lung damage. The cause is not known.TRALI can occur within hours of a transfusion or several days later.  Sudden (acute) or delayed hemolytic reactions. This happens if your blood does not match the cells in your transfusion. Your body's defense system (immune system) may try to attack the new cells. This complication is rare.  Infection. This is rare. BEFORE THE PROCEDURE  You may have a blood test to determine your blood type. This is necessary to know what kind of blood your  body will accept.  If you are going to have a planned surgery, you may donate your own blood. This may be done in case you need to have a transfusion.  If you have had an allergic reaction to a transfusion in the past, you may be given medicine to help prevent a reaction. Take this medicine only as directed by your health care provider.  You will have your temperature, blood pressure, and pulse monitored before the transfusion. PROCEDURE   An IV will be started in your hand or arm.  The bag of donated blood will be attached to your IV tube and given into your vein.  Your temperature, blood pressure, and pulse will be monitored regularly during the transfusion. This monitoring is done to detect early signs of a transfusion reaction.  If you have any signs or symptoms of a reaction, your transfusion will be stopped and you may be given medicine.  When the transfusion is over, your IV will be removed.  Pressure may be applied to the IV site for a few minutes.  A bandage (dressing) will be applied. The procedure may vary among health care providers and hospitals. AFTER THE PROCEDURE  Your blood pressure, temperature, and pulse will be monitored regularly.   This information is not intended to replace advice given to you by your health care provider. Make sure you discuss any questions you have with your health care provider.   Document Released: 12/03/2000 Document Revised: 12/27/2014 Document Reviewed: 10/16/2014 Elsevier Interactive Patient Education 2016 [place].  Blood Transfusion, Care After These instructions give  you information about caring for yourself after your procedure. Your doctor may also give you more specific instructions. Call your doctor if you have any problems or questions after your procedure.  HOME CARE  Take medicines only as told by your doctor. Ask your doctor if you can take an over-the-counter pain reliever if you have a fever or headache a day or two  after your procedure.  Return to your normal activities as told by your doctor. GET HELP IF:   You develop redness or irritation at your IV site.  You have a fever, chills, or a headache that does not go away.  Your pee (urine) is darker than normal.  Your urine turns:  Pink.  Red.  [place]ark.  The white part of your eye turns yellow (jaundice).  You feel weak after doing your normal activities. GET HELP RIGHT AWAY IF:   You have trouble breathing.  You have fever and chills and you also have:  Anxiety.  Chest or back pain.  Flushed or pink skin.  Clammy or sweaty skin.  A fast heartbeat.  A sick feeling in your stomach (nausea).   This information is not intended to replace advice given to you by your health care provider. Make sure you discuss any questions you have with your health care provider.   Document Released: 12/27/2014 Document Reviewed: 12/27/2014 Elsevier Interactive Patient Education 2016 [employer].  Anemia, Nonspecific Anemia is a condition in which the concentration of red blood cells or hemoglobin in the blood is below normal. Hemoglobin is a substance in red blood cells that carries oxygen to the tissues of the body. Anemia results in not enough oxygen reaching these tissues.  CAUSES  Common causes of anemia include:   Excessive bleeding. Bleeding may be internal or external. This includes excessive bleeding from periods (in women) or from the intestine.   Poor nutrition.   Chronic kidney, thyroid, and liver disease.  Bone marrow disorders that decrease red blood cell production.  Cancer and treatments for cancer.  HIV, AIDS, and their treatments.  Spleen problems that increase red blood cell destruction.  Blood disorders.  Excess destruction of red blood cells due to infection, medicines, and autoimmune disorders. SIGNS AND SYMPTOMS   Minor weakness.   Dizziness.   Headache.  Palpitations.   Shortness of breath,  especially with exercise.   Paleness.  Cold sensitivity.  Indigestion.  Nausea.  Difficulty sleeping.  Difficulty concentrating. Symptoms may occur suddenly or they may develop slowly.  DIAGNOSIS  Additional blood tests are often needed. These help your health care provider determine the best treatment. Your health care provider will check your stool for blood and look for other causes of blood loss.  TREATMENT  Treatment varies depending on the cause of the anemia. Treatment can include:   Supplements of iron, vitamin 123456, or folic acid.   Hormone medicines.   A blood transfusion. This may be needed if blood loss is severe.   Hospitalization. This may be needed if there is significant continual blood loss.   Dietary changes.  Spleen removal. HOME CARE INSTRUCTIONS Keep all follow-up appointments. It often takes many weeks to correct anemia, and having your health care provider check on your condition and your response to treatment is very important. SEEK IMMEDIATE MEDICAL CARE IF:   You develop extreme weakness, shortness of breath, or chest pain.   You become dizzy or have trouble concentrating.  You develop heavy vaginal bleeding.   You develop a  rash.   You have bloody or black, tarry stools.   You faint.   You vomit up blood.   You vomit repeatedly.   You have abdominal pain.  You have a fever or persistent symptoms for more than 2-3 days.   You have a fever and your symptoms suddenly get worse.   You are dehydrated.  MAKE SURE YOU:  Understand these instructions.  Will watch your condition.  Will get help right away if you are not doing well or get worse.   This information is not intended to replace advice given to you by your health care provider. Make sure you discuss any questions you have with your health care provider.   Document Released: 01/13/2005 Document Revised: 08/08/2013 Document Reviewed: 06/01/2013 Elsevier  Interactive Patient Education [employer].

## 2016-02-19 NOTE — Plan of Care (Signed)
Problem: Education: Goal: Knowledge of [facility] Education information/materials will improve Outcome: Progressing Pt received 2 units of RBC, no complications, tolerated well.

## 2016-02-20 ENCOUNTER — Encounter: Payer: Self-pay | Admitting: *Deleted

## 2016-02-20 ENCOUNTER — Observation Stay: Payer: 59

## 2016-02-20 ENCOUNTER — Other Ambulatory Visit: Payer: Self-pay | Admitting: Internal Medicine

## 2016-02-20 ENCOUNTER — Other Ambulatory Visit: Payer: Self-pay | Admitting: *Deleted

## 2016-02-20 DIAGNOSIS — R0602 Shortness of breath: Secondary | ICD-10-CM | POA: Diagnosis not present

## 2016-02-20 DIAGNOSIS — R42 Dizziness and giddiness: Secondary | ICD-10-CM | POA: Diagnosis not present

## 2016-02-20 DIAGNOSIS — D509 Iron deficiency anemia, unspecified: Secondary | ICD-10-CM | POA: Diagnosis not present

## 2016-02-20 DIAGNOSIS — K769 Liver disease, unspecified: Secondary | ICD-10-CM | POA: Diagnosis not present

## 2016-02-20 DIAGNOSIS — D649 Anemia, unspecified: Secondary | ICD-10-CM

## 2016-02-20 LAB — PROTIME-INR
INR: 1.15
PROTHROMBIN TIME: 14.9 s (ref 11.4–15.0)

## 2016-02-20 LAB — HAPTOGLOBIN: HAPTOGLOBIN: 106 mg/dL (ref 34–200)

## 2016-02-20 LAB — APTT: aPTT: 27 seconds (ref 24–36)

## 2016-02-20 LAB — HEMOGLOBIN: HEMOGLOBIN: 7.8 g/dL — AB (ref 13.0–18.0)

## 2016-02-20 MED ORDER — MIDAZOLAM HCL 5 MG/5ML IJ SOLN
INTRAMUSCULAR | Status: AC
  Filled 2016-02-20: qty 10

## 2016-02-20 MED ORDER — MIDAZOLAM HCL 5 MG/5ML IJ SOLN
INTRAMUSCULAR | Status: DC | PRN
  Administered 2016-02-20: 14:00:00 0.5 mg via INTRAVENOUS
  Administered 2016-02-20: 14:00:00 2 mg via INTRAVENOUS
  Administered 2016-02-20: 1 mg via INTRAVENOUS

## 2016-02-20 MED ORDER — SODIUM CHLORIDE 0.9 % IV SOLN
INTRAVENOUS | Status: DC
  Administered 2016-02-20: 14:00:00 via INTRAVENOUS

## 2016-02-20 MED ORDER — FENTANYL CITRATE (PF) 100 MCG/2ML IJ SOLN
INTRAMUSCULAR | Status: AC
  Filled 2016-02-20: qty 4

## 2016-02-20 MED ORDER — HYDROCODONE-ACETAMINOPHEN 5-325 MG PO TABS: 1.0000 | ORAL_TABLET | ORAL | Status: DC | PRN

## 2016-02-20 MED ORDER — FENTANYL CITRATE (PF) 100 MCG/2ML IJ SOLN
INTRAMUSCULAR | Status: DC | PRN
  Administered 2016-02-20: 50 ug via INTRAVENOUS
  Administered 2016-02-20: 25 ug via INTRAVENOUS

## 2016-02-20 NOTE — Procedures (Signed)
Interventional Radiology Procedure Note  Procedure:  US guided core biopsy liver mass  Complications: None  Estimated Blood Loss: <25 mL  Recommendations: - Bedrest x 3 hrs - Path pending  Signed,  Criselda Peaches, MD

## 2016-02-20 NOTE — Progress Notes (Signed)
Charles Jensen   DOB:23-Aug-1981   [id number]    Subjective: patient's dizziness/shortness of breath improved since the PRBC transfusion. He still continues to have cough.   ROS: otherwise no blood in stools no chest pain. Denies any diarrhea or constipation.  Objective:  Filed Vitals:   02/19/16 2125 02/20/16 0548  BP: 137/75 126/64  Pulse: 104 82  Temp: 99 F (37.2 C) 98.1 F (36.7 C)  Resp:       Intake/Output Summary (Last 24 hours) at 02/20/16 1117 Last data filed at 02/20/16 1100  Gross per 24 hour  Intake    240 ml  Output    200 ml  Net     40 ml    GENERAL: Well-nourished well-developed; Alert, no distress and comfortable. With his wife. EYES: positive for pallor OROPHARYNX: no thrush or ulceration; good dentition  NECK: supple, no masses felt LYMPH: no palpable lymphadenopathy in the cervical, axillary or inguinal regions LUNGS: clear to auscultation and No wheeze or crackles HEART/CVS: regular rate & rhythm and no murmurs; No lower extremity edema ABDOMEN: abdomen soft, non-tender and normal bowel sounds Musculoskeletal:no cyanosis of digits and no clubbing  PSYCH: alert & oriented x 3 with fluent speech NEURO: no focal motor/sensory deficits SKIN: no rashes or significant lesions   Labs:  Lab Results  Component Value Date   WBC 7.8 02/19/2016   HGB 7.8* 02/20/2016   HCT 27.4* 02/19/2016   MCV 56.2* 02/19/2016   PLT 281 02/19/2016    Lab Results  Component Value Date   NA 139 02/19/2016   K 4.5 02/19/2016   CL 109 02/19/2016   CO2 24 02/19/2016    Studies:  Ct Chest Wo Contrast  02/19/2016  CLINICAL DATA:  Cough x4 weeks EXAM: CT CHEST WITHOUT CONTRAST TECHNIQUE: Multidetector CT imaging of the chest was performed following the standard protocol without IV contrast. COMPARISON:  None. FINDINGS: Mediastinum/Lymph Nodes: No masses or pathologically enlarged lymph nodes identified on this un-enhanced exam. No pericardial effusion.  Lungs/Pleura: Patchy airspace opacities in the superior and posterior basal segments left lower lobe. Right lung clear. No pleural effusion. Upper abdomen: 11.1 x 8 cm low-attenuation lobular partially exophytic mass in hepatic segment 6, incompletely visualized. 5.6 x 3.8 cm low attenuation partially exophytic mass from hepatic segment 3. Musculoskeletal: No chest wall mass or suspicious bone lesions identified. Minimal spurring in the lower thoracic spine. IMPRESSION: 1. Patchy left lower lobe airspace opacities suggesting pneumonia. 2. 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 Recommend MR liver with contrast for further possibly definitive characterization. These results will be called to the ordering clinician or representative by the Radiologist Assistant, and communication documented in the PACS or zVision Dashboard. Electronically Signed   By: Lucrezia Europe M.D.   On: 02/19/2016 14:01   Mr Liver W Wo Contrast  02/19/2016  ADDENDUM REPORT: 02/19/2016 17:43 ADDENDUM: In speaking with Dr. Verdell Carmine patient has laboratory values inconsistent hemochromatosis. Liver masses are still indeterminate and warrant biopsy. Findings conveyed toVIVEK SAINANI on 02/19/2016  at17:41. Electronically Signed   By: Suzy Bouchard M.D.   On: 02/19/2016 17:43  02/19/2016  CLINICAL DATA:  Anemia, liver lesion. EXAM: MRI ABDOMEN WITHOUT AND WITH CONTRAST TECHNIQUE: Multiplanar multisequence MR imaging of the abdomen was performed both before and after the administration of intravenous contrast. Apparently contrast was injected at the beginning of scan therefore non noncontrast imaging is performed. CONTRAST:  11mL MULTIHANCE GADOBENATE DIMEGLUMINE 529 MG/ML IV SOLN -- COMPARISON:  CT  02/19/2016 FINDINGS: Lower chest:  Lung bases are clear. Hepatobiliary: There is a thick-walled lobular mass within the RIGHT hepatic lobe measuring 11.2 by 9.7 cm. This thick-walled lesion has a presumed thickened enhancing rim. No  precontrast imaging was performed therefore enhancement cannot be characterized. Centrally within the lesion there is high signal intensity on T2 weighted imaging suggesting necrosis / fluid. Similar lesion in the LEFT hepatic lobe by without the central fluid component measuring 4.8 x 3.2 cm (image 47, series 10). On the T2 weighted imaging there is low signal intensity throughout the liver parenchyma suggesting iron overload. Pancreas: There is relatively low signal intensity with the pancreas on this is more difficult to define. Spleen: Low signal intensity on T2 weighted imaging in the spleen (series 3) Adrenals/urinary tract: Adrenal glands and kidneys are normal. Stomach/Bowel: Stomach and limited of the small bowel is unremarkable Vascular/Lymphatic: Abdominal aortic normal caliber. No retroperitoneal periportal lymphadenopathy. Musculoskeletal: Diffuse low signal intensity within the spine on T2 weighted imaging (series 3) IMPRESSION: 1. Large mass in RIGHT hepatic lobe with thick enhancing (presumed) rim is concerning for malignancy. 2. A second lesion in the lateral LEFT hepatic lobe with presumed same etiology. 3. 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 Hemochromatosis can be associated with hepatocellular carcinoma. These results will be called to the ordering clinician or representative by the Radiologist Assistant, and communication documented in the PACS or zVision Dashboard. Electronically Signed: By: Suzy Bouchard M.D. On: 02/19/2016 17:26    Assessment & Plan:   # incidental lesions noted on the liver- initially on the CAT scan of the chest; and subsequently noted on the MRI of the liver. I reviewed the images myself reviewed the images with the patient and his wife in detail. The etiology of the lesions is unclear; however on my discussion with the radiologist over the phone-  highly concerning for malignant lesions. I would recommend a ultrasound  Guided biopsy of the liver lesion. Discussed with the patient-he agrees.  # iron deficiency anemia- the etiology is unclear; however a suspect patient has probable GI malignancy/which is currently asymptomatic the suspicious history high in the context of his liver lesions].   # cough- left lower lobe mild infiltrate on noncontrast CT. Patient is on antibiotics as per primary team  Check a CEA AFP. Check PT PTT. Also spoke to Dr. Verdell Carmine- regarding the above plan. The patient could potentially be discharged after the procedure/once medically stable. Patient will follow-up in the clinic with next week to review the results of the biopsy.    Cammie Sickle, MD 02/20/2016  11:17 AM

## 2016-02-20 NOTE — Progress Notes (Signed)
[facility] requisition left at front reception desk for patient to pick up. Orders sent to cancer center scheduling to have pt come on Friday 02/27/16 in [place] to see lab/md/IV fereheme'bx results.  Per md, future apts can be scheduled in [place] [facility].

## 2016-02-20 NOTE — Discharge Summary (Signed)
[facility] [place] [facility] [place] NAME: Charles Jensen    MR#:  [id number]  DATE OF BIRTH:  04-Mar-1981  DATE OF ADMISSION:  02/18/2016 ADMITTING PHYSICIAN: Lance Coon, MD  DATE OF DISCHARGE: 02/20/2016  PRIMARY CARE PHYSICIAN: No PCP Per Patient    ADMISSION DIAGNOSIS:  Cough variant asthma [J45.991] Symptomatic anemia [D64.9]  DISCHARGE DIAGNOSIS:  Principal Problem:   Symptomatic anemia Active Problems:   Cough variant asthma   SECONDARY DIAGNOSIS:   Past Medical History  Diagnosis Date  . Cough variant asthma   . Obesity   . Anemia   . Liver lesion     HOSPITAL COURSE:   35 year old male with no significant past medical history presented to the hospital due to weakness, shortness of breath and noted to be anemic.  #1 anemia-patient was noted to have severe iron deficiency anemia. Etiology is still unclear. -Patient was transfused a total of 2 units of packed red blood cells and hemoglobin has improved and is stable now. -She was seen by hematology and also received some IV iron. He will continue follow-up with them as an outpatient. -Continue iron supplements.  #2 cough-this is been chronic in nature. Etiology is also unclear. -CT chest showed a suspected atypical pneumonia. Patient is being discharged on a prednisone taper, oral Levaquin. -Continue Symbicort and outpatient follow-up. Patient can get a primary care physician and get PFTs and if needed referral to pulmonary.  #3. Liver Mass-patient on CT chest was incidentally noted to have a liver mass. MRI of the abdomen confirming 2 large liver masses but the etiology of which is unclear. -Appreciate oncology input and arranging ultrasound-guided liver biopsy which was done today. Patient will follow-up with oncology as an outpatient for further workup of his liver masses.  Patient is clinically stable and therefore being discharged home with follow-up with oncology as an  outpatient.  DISCHARGE CONDITIONS:   Stable  CONSULTS OBTAINED:  Treatment Team:  Cammie Sickle, MD  DRUG ALLERGIES:  No Known Allergies  DISCHARGE MEDICATIONS:   Current Discharge Medication List    START taking these medications   Details  ferrous sulfate 325 (65 FE) MG tablet Take 1 tablet (325 mg total) by mouth 2 (two) times daily with a meal. Qty: 60 tablet, Refills: 3    levofloxacin (LEVAQUIN) 750 MG tablet Take 1 tablet (750 mg total) by mouth daily. Qty: 5 tablet, Refills: 0    predniSONE (DELTASONE) 10 MG tablet Label  & dispense according to the schedule below. 5 Pills PO for 1 day then, 4 Pills PO for 1 day, 3 Pills PO for 1 day, 2 Pills PO for 1 day, 1 Pill PO for 1 days then STOP. Qty: 15 tablet, Refills: 0      CONTINUE these medications which have CHANGED   Details  budesonide-formoterol (SYMBICORT) 160-4.5 MCG/ACT inhaler Inhale 2 puffs into the lungs 2 (two) times daily. Qty: 1 Inhaler, Refills: 12      STOP taking these medications     ibuprofen (ADVIL,MOTRIN) 200 MG tablet          DISCHARGE INSTRUCTIONS:   DIET:  Regular diet  DISCHARGE CONDITION:  Stable  ACTIVITY:  Activity as tolerated  OXYGEN:  Home Oxygen: No.   Oxygen Delivery: room air  DISCHARGE LOCATION:  home   If you experience worsening of your admission symptoms, develop shortness of breath, life threatening emergency, suicidal or homicidal thoughts you must seek medical attention immediately  by calling 911 or calling your MD immediately  if symptoms less severe.  You Must read complete instructions/literature along with all the possible adverse reactions/side effects for all the Medicines you take and that have been prescribed to you. Take any new Medicines after you have completely understood and accpet all the possible adverse reactions/side effects.   Please note  You were cared for by a hospitalist during your hospital stay. If you have any questions  about your discharge medications or the care you received while you were in the hospital after you are discharged, you can call the unit and asked to speak with the hospitalist on call if the hospitalist that took care of you is not available. Once you are discharged, your primary care physician will handle any further medical issues. Please note that NO REFILLS for any discharge medications will be authorized once you are discharged, as it is imperative that you return to your primary care physician (or establish a relationship with a primary care physician if you do not have one) for your aftercare needs so that they can reassess your need for medications and monitor your lab values.     Today   No abdominal pain, nausea, vomiting. Still has a cough which is nonproductive. Afebrile. Going for Liver biopsy later today.  VITAL SIGNS:  Blood pressure 105/67, pulse 95, temperature 98.1 F (36.7 C), temperature source Oral, resp. rate 22, height 6\' 1"  (1.854 m), weight 113.399 kg (250 lb), SpO2 97 %.  I/O:   Intake/Output Summary (Last 24 hours) at 02/20/16 1729 Last data filed at 02/20/16 1100  Gross per 24 hour  Intake      0 ml  Output    200 ml  Net   -200 ml    PHYSICAL EXAMINATION:  GENERAL:  35 y.o.-year-old patient lying in the bed with no acute distress.  EYES: Pupils equal, round, reactive to light and accommodation. No scleral icterus. Extraocular muscles intact.  HEENT: Head atraumatic, normocephalic. Oropharynx and nasopharynx clear.  NECK:  Supple, no jugular venous distention. No thyroid enlargement, no tenderness.  LUNGS: Normal breath sounds bilaterally, no wheezing, rales,rhonchi. No use of accessory muscles of respiration.  CARDIOVASCULAR: S1, S2 normal. No murmurs, rubs, or gallops.  ABDOMEN: Soft, non-tender, non-distended. Bowel sounds present. No organomegaly or mass.  EXTREMITIES: No pedal edema, cyanosis, or clubbing.  NEUROLOGIC: Cranial nerves II through XII  are intact. No focal motor or sensory defecits b/l.  PSYCHIATRIC: The patient is alert and oriented x 3. Good affect.  SKIN: No obvious rash, lesion, or ulcer.   DATA REVIEW:   CBC  Recent Labs Lab 02/19/16 0428 02/20/16 0418  WBC 7.8  --   HGB 7.6* 7.8*  HCT 27.4*  --   PLT 281  --     Chemistries   Recent Labs Lab 02/18/16 1740 02/19/16 0428  NA 137 139  K 3.8 4.5  CL 108 109  CO2 25 24  GLUCOSE 93 126*  BUN 10 11  CREATININE 0.72 0.71  CALCIUM 8.5* 8.8*  AST 16  --   ALT 13*  --   ALKPHOS 52  --   BILITOT 0.8  --     Cardiac Enzymes No results for input(s): TROPONINI in the last 168 hours.  Microbiology Results  No results found for this or any previous visit.  RADIOLOGY:  Ct Chest Wo Contrast  02/19/2016  CLINICAL DATA:  Cough x4 weeks EXAM: CT CHEST WITHOUT CONTRAST TECHNIQUE: Multidetector CT  imaging of the chest was performed following the standard protocol without IV contrast. COMPARISON:  None. FINDINGS: Mediastinum/Lymph Nodes: No masses or pathologically enlarged lymph nodes identified on this un-enhanced exam. No pericardial effusion. Lungs/Pleura: Patchy airspace opacities in the superior and posterior basal segments left lower lobe. Right lung clear. No pleural effusion. Upper abdomen: 11.1 x 8 cm low-attenuation lobular partially exophytic mass in hepatic segment 6, incompletely visualized. 5.6 x 3.8 cm low attenuation partially exophytic mass from hepatic segment 3. Musculoskeletal: No chest wall mass or suspicious bone lesions identified. Minimal spurring in the lower thoracic spine. IMPRESSION: 1. Patchy left lower lobe airspace opacities suggesting pneumonia. 2. 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 Recommend MR liver with contrast for further possibly definitive characterization. These results will be called to the ordering clinician or representative by the Radiologist Assistant, and communication documented in the PACS or  zVision Dashboard. Electronically Signed   By: Lucrezia Europe M.D.   On: 02/19/2016 14:01   Mr Liver W Wo Contrast  02/19/2016  ADDENDUM REPORT: 02/19/2016 17:43 ADDENDUM: In speaking with Dr. Verdell Carmine patient has laboratory values inconsistent hemochromatosis. Liver masses are still indeterminate and warrant biopsy. Findings conveyed toVIVEK Plato Alspaugh on 02/19/2016  at17:41. Electronically Signed   By: Suzy Bouchard M.D.   On: 02/19/2016 17:43  02/19/2016  CLINICAL DATA:  Anemia, liver lesion. EXAM: MRI ABDOMEN WITHOUT AND WITH CONTRAST TECHNIQUE: Multiplanar multisequence MR imaging of the abdomen was performed both before and after the administration of intravenous contrast. Apparently contrast was injected at the beginning of scan therefore non noncontrast imaging is performed. CONTRAST:  2mL MULTIHANCE GADOBENATE DIMEGLUMINE 529 MG/ML IV SOLN -- COMPARISON:  CT 02/19/2016 FINDINGS: Lower chest:  Lung bases are clear. Hepatobiliary: There is a thick-walled lobular mass within the RIGHT hepatic lobe measuring 11.2 by 9.7 cm. This thick-walled lesion has a presumed thickened enhancing rim. No precontrast imaging was performed therefore enhancement cannot be characterized. Centrally within the lesion there is high signal intensity on T2 weighted imaging suggesting necrosis / fluid. Similar lesion in the LEFT hepatic lobe by without the central fluid component measuring 4.8 x 3.2 cm (image 47, series 10). On the T2 weighted imaging there is low signal intensity throughout the liver parenchyma suggesting iron overload. Pancreas: There is relatively low signal intensity with the pancreas on this is more difficult to define. Spleen: Low signal intensity on T2 weighted imaging in the spleen (series 3) Adrenals/urinary tract: Adrenal glands and kidneys are normal. Stomach/Bowel: Stomach and limited of the small bowel is unremarkable Vascular/Lymphatic: Abdominal aortic normal caliber. No retroperitoneal periportal  lymphadenopathy. Musculoskeletal: Diffuse low signal intensity within the spine on T2 weighted imaging (series 3) IMPRESSION: 1. Large mass in RIGHT hepatic lobe with thick enhancing (presumed) rim is concerning for malignancy. 2. A second lesion in the lateral LEFT hepatic lobe with presumed same etiology. 3. 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 Hemochromatosis can be associated with hepatocellular carcinoma. These results will be called to the ordering clinician or representative by the Radiologist Assistant, and communication documented in the PACS or zVision Dashboard. Electronically Signed: By: Suzy Bouchard M.D. On: 02/19/2016 17:26   US Biopsy  02/20/2016  INDICATION: 35 year old male with profound anemia and enhancing hepatic lesions concerning for possible metastatic disease. EXAM: Ultrasound-guided core biopsy of the liver Interventional Radiologist:  Criselda Peaches, MD MEDICATIONS: None. ANESTHESIA/SEDATION: Fentanyl 3.5 mcg IV; Versed  75 mg IV Moderate Sedation Time:  22 minutes The patient was continuously monitored during the procedure by the interventional radiology nurse under my direct supervision. FLUOROSCOPY TIME:  None COMPLICATIONS: None immediate. PROCEDURE: Informed consent was obtained from the patient following explanation of the procedure, risks, benefits and alternatives. The patient understands, agrees and consents for the procedure. All questions were addressed. A time out was performed. The right upper quadrant was interrogated with ultrasound. The smaller lesion in the left hepatic lobe could not be identified confidently with ultrasound secondary to obscuring bowel gas in the adjacent stomach. A suitable relatively avascular plane to approach the deep right hepatic lesion was identified. A suitable skin entry site was selected and marked. The region was then  sterilely prepped and draped in standard fashion using chlorhexidine skin prep. Local anesthesia was attained by infiltration with 1% lidocaine. A small dermatotomy was made. Under real-time sonographic guidance, a 17 gauge trocar needle was advanced into the liver and positioned at the margin of the mass. Multiple 18 gauge core biopsies were then coaxially obtained. Biopsy placement was attempted peripherally rather than centrally in the presumed necrotic component. Needle placement was confirmed on all biopsy passes with real-time sonography. Biopsy specimens were placed in formalin and delivered to pathology for further analysis. As the introducer needle was removed, the biopsy tract was embolized with a Gel-Foam slurry. Post biopsy ultrasound imaging demonstrates no active bleeding or perihepatic hematoma. The patient tolerated the procedure well. IMPRESSION: Technically successful ultrasound-guided core biopsy of the centrally necrotic lesion in the right liver. Signed, Criselda Peaches, MD [facility] Electronically Signed   By: Jacqulynn Cadet M.D.   On: 02/20/2016 14:55      Management plans discussed with the patient, family and they are in agreement.  CODE STATUS:     Code Status Orders        Start     Ordered   02/19/16 0046  Full code   Continuous     02/19/16 0045    Code Status History    Date Active Date Inactive Code Status Order ID Comments User Context   This patient has a current code status but no historical code status.      TOTAL TIME TAKING CARE OF THIS PATIENT: 40 minutes.    Henreitta Leber M.D on 02/20/2016 at 5:29 PM  Between 7am to 6pm - Pager - [phone]  After 6pm go to [email or web address] - password [email or web address] [facility]  [facility]  Office  [phone]  CC: Primary care physician; No PCP Per Patient

## 2016-02-20 NOTE — Progress Notes (Signed)
Received MD order to discharge patient to home, reviewed prescriptions, discharge instructions, and home meds with patient and patient verbalized understanding discharged to home with wife.

## 2016-02-21 LAB — AFP TUMOR MARKER: AFP TUMOR MARKER: 1.6 ng/mL (ref 0.0–8.3)

## 2016-02-21 LAB — CEA: CEA: 0.5 ng/mL (ref 0.0–4.7)

## 2016-02-23 ENCOUNTER — Telehealth: Payer: Self-pay | Admitting: Internal Medicine

## 2016-02-23 ENCOUNTER — Telehealth: Payer: Self-pay | Admitting: *Deleted

## 2016-02-23 ENCOUNTER — Other Ambulatory Visit: Payer: Self-pay | Admitting: Internal Medicine

## 2016-02-23 LAB — SURGICAL PATHOLOGY

## 2016-02-23 NOTE — Telephone Encounter (Signed)
Asking for Dr Rogue Bussing to call him regarding his lab results [phone]

## 2016-02-23 NOTE — Telephone Encounter (Signed)
Spoke to the patient regarding the results of the liver biopsy; positive for hemangioma/ no malignancy noted.  Spoke to Dr. Luana Shu  In pathology;  Good samples.  Patient follow-up with me  On the 10th March; IV iron/ also discussed regarding EGD colonoscopy.

## 2016-02-26 ENCOUNTER — Other Ambulatory Visit: Payer: Self-pay | Admitting: Internal Medicine

## 2016-02-27 ENCOUNTER — Inpatient Hospital Stay (HOSPITAL_BASED_OUTPATIENT_CLINIC_OR_DEPARTMENT_OTHER): Payer: 59 | Admitting: Internal Medicine

## 2016-02-27 ENCOUNTER — Inpatient Hospital Stay: Payer: 59 | Attending: Internal Medicine

## 2016-02-27 ENCOUNTER — Inpatient Hospital Stay: Payer: 59

## 2016-02-27 ENCOUNTER — Telehealth: Payer: Self-pay | Admitting: *Deleted

## 2016-02-27 VITALS — BP 126/84 | HR 86 | Temp 97.5°F | Resp 18

## 2016-02-27 VITALS — BP 146/89 | HR 109 | Temp 96.7°F | Resp 18 | Wt 245.4 lb

## 2016-02-27 DIAGNOSIS — E669 Obesity, unspecified: Secondary | ICD-10-CM | POA: Insufficient documentation

## 2016-02-27 DIAGNOSIS — Z79899 Other long term (current) drug therapy: Secondary | ICD-10-CM | POA: Insufficient documentation

## 2016-02-27 DIAGNOSIS — D649 Anemia, unspecified: Secondary | ICD-10-CM

## 2016-02-27 DIAGNOSIS — D509 Iron deficiency anemia, unspecified: Secondary | ICD-10-CM | POA: Insufficient documentation

## 2016-02-27 DIAGNOSIS — D1809 Hemangioma of other sites: Secondary | ICD-10-CM | POA: Insufficient documentation

## 2016-02-27 DIAGNOSIS — D508 Other iron deficiency anemias: Secondary | ICD-10-CM

## 2016-02-27 LAB — CBC WITH DIFFERENTIAL/PLATELET
Basophils Absolute: 0.1 10*3/uL (ref 0–0.1)
EOS ABS: 0.3 10*3/uL (ref 0–0.7)
Eosinophils Relative: 5 %
HCT: 33.6 % — ABNORMAL LOW (ref 40.0–52.0)
HEMOGLOBIN: 10.1 g/dL — AB (ref 13.0–18.0)
LYMPHS ABS: 1.7 10*3/uL (ref 1.0–3.6)
Lymphocytes Relative: 27 %
MCH: 19.2 pg — ABNORMAL LOW (ref 26.0–34.0)
MCHC: 30 g/dL — ABNORMAL LOW (ref 32.0–36.0)
MCV: 64.1 fL — ABNORMAL LOW (ref 80.0–100.0)
Monocytes Absolute: 0.6 10*3/uL (ref 0.2–1.0)
NEUTROS ABS: 3.6 10*3/uL (ref 1.4–6.5)
Platelets: 232 10*3/uL (ref 150–440)
RBC: 5.24 MIL/uL (ref 4.40–5.90)
RDW: 38.4 % — ABNORMAL HIGH (ref 11.5–14.5)
WBC: 6.2 10*3/uL (ref 3.8–10.6)

## 2016-02-27 MED ORDER — SODIUM CHLORIDE 0.9 % IV SOLN
510.0000 mg | Freq: Once | INTRAVENOUS | Status: AC
  Administered 2016-02-27: 510 mg via INTRAVENOUS
  Filled 2016-02-27: qty 17

## 2016-02-27 MED ORDER — SODIUM CHLORIDE 0.9 % IV SOLN
Freq: Once | INTRAVENOUS | Status: AC
  Administered 2016-02-27: 10:00:00 via INTRAVENOUS
  Filled 2016-02-27: qty 1000

## 2016-02-27 NOTE — Progress Notes (Signed)
[place] OFFICE PROGRESS NOTE  Patient Care Team: No Pcp Per Patient as PCP - General (General Practice)   SUMMARY OF HEMATOLOGIC HISTORY:  # FEB 2017 SEVERE IRON DEFICIENCY ANEMIA [likley chronic] ? Etiology on IV iron.   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 No history of any GI blood loss. Patient received 2 units of blood; and also IV iron in the hospital. Incidentally patient was noted to have large enhancing lesions in the liver; concerning for malignancy on the MRI of the liver. Biopsy- showed hemangiomas.  Patient noted significant improvement in his shortness of breath/energy levels. Continues to deny any blood in stools black stools. He is also on by mouth iron. Appetite is improving. Cough resolved.    REVIEW OF SYSTEMS:  A complete 10 point review of system is done which is negative except mentioned above/history of present illness.   PAST MEDICAL HISTORY :  Past Medical History  Diagnosis Date  . Cough variant asthma   . Obesity   . Anemia   . Liver lesion     PAST SURGICAL HISTORY :   Past Surgical History  Procedure Laterality Date  . Appendectomy    . Hernia repair      2015    FAMILY HISTORY :   Family History  Problem Relation Age of Onset  . Heart attack Mother   . CAD Father   . Hypertension Father     SOCIAL HISTORY:   Social History  Substance Use Topics  . Smoking status: Never Smoker   . Smokeless tobacco: Not on file  . Alcohol Use: No    ALLERGIES:  has No Known Allergies.  MEDICATIONS:  Current Outpatient Prescriptions  Medication Sig Dispense Refill  . budesonide-formoterol (SYMBICORT) 160-4.5 MCG/ACT inhaler Inhale 2 puffs into the lungs 2 (two) times daily. 1 Inhaler 12  . ferrous sulfate 325 (65 FE) MG tablet Take 1 tablet (325 mg total) by mouth 2  (two) times daily with a meal. 60 tablet 3  . albuterol (PROAIR HFA) 108 (90 Base) MCG/ACT inhaler Inhale into the lungs.     No current facility-administered medications for this visit.    PHYSICAL EXAMINATION:   BP 146/89 mmHg  Pulse 109  Temp(Src) 96.7 F (35.9 C) (Tympanic)  Resp 18  Wt 245 lb 6 oz (111.3 kg)  Filed Weights   02/27/16 0838  Weight: 245 lb 6 oz (111.3 kg)    GENERAL: Well-nourished well-developed; Alert, no distress and comfortable.  Accompanied by wife.  EYES: no pallor or icterus OROPHARYNX: no thrush or ulceration; good dentition  NECK: supple, no masses felt LYMPH:  no palpable lymphadenopathy in the cervical, axillary or inguinal regions LUNGS: clear to auscultation and  No wheeze or crackles HEART/CVS: regular rate & rhythm and no murmurs; No lower extremity edema ABDOMEN:abdomen soft, non-tender and normal bowel sounds Musculoskeletal:no cyanosis of digits and no clubbing  PSYCH: alert & oriented x 3 with fluent speech NEURO: no focal motor/sensory deficits SKIN:  no rashes or significant lesions  LABORATORY DATA:  I have reviewed the data as listed    Component Value Date/Time   NA 139 02/19/2016 0428   K 4.5 02/19/2016 0428   CL 109 02/19/2016 0428   CO2 24 02/19/2016 0428   GLUCOSE 126* 02/19/2016 0428   BUN 11 02/19/2016 0428  CREATININE 0.71 02/19/2016 0428   CALCIUM 8.8* 02/19/2016 0428   PROT 7.1 02/18/2016 1740   ALBUMIN 4.3 02/18/2016 1740   AST 16 02/18/2016 1740   ALT 13* 02/18/2016 1740   ALKPHOS 52 02/18/2016 1740   BILITOT 0.8 02/18/2016 1740   GFRNONAA >60 02/19/2016 0428   GFRAA >60 02/19/2016 0428    No results found for: SPEP, UPEP  Lab Results  Component Value Date   WBC 6.2 02/27/2016   NEUTROABS 3.6 02/27/2016   HGB 10.1* 02/27/2016   HCT 33.6* 02/27/2016   MCV 64.1* 02/27/2016   PLT 232 02/27/2016      Chemistry      Component Value Date/Time   NA 139 02/19/2016 0428   K 4.5 02/19/2016 0428   CL  109 02/19/2016 0428   CO2 24 02/19/2016 0428   BUN 11 02/19/2016 0428   CREATININE 0.71 02/19/2016 0428      Component Value Date/Time   CALCIUM 8.8* 02/19/2016 0428   ALKPHOS 52 02/18/2016 1740   AST 16 02/18/2016 1740   ALT 13* 02/18/2016 1740   BILITOT 0.8 02/18/2016 1740         ASSESSMENT & PLAN:    # SEVERE IRON DEFICIENCY ANEMIA- of unclear etiology. Recommend GI evaluation with colonoscopy/EGD. Patient might need a capsule study for further evaluation. Continue IV iron; and CBC checks.  # Liver lesions- concerning for malignancy on MRI/imaging- however biopsy benign hemangiomas. Spoke to pathology/Dr. Luana Shu- good biopsy samples/no evidence of malignancy. He was given a copy of his surgical pathology.  # ? Exposure to Coccidiomycoses/- awaiting EIA done through [facility].  # Recommend IV iron today again next week; recheck CBC in 1 month. Follow-up with me in approximately 2 months CBC and studies ferritin [done through [facility]  # 25 minutes face-to-face with the patient discussing the above plan of care; more than 50% of time spent on prognosis/ natural history; counseling and coordination.       Cammie Sickle, MD 02/27/2016 8:55 AM

## 2016-02-27 NOTE — Telephone Encounter (Signed)
Pt called and states he has some bumps that came up and he is itching.  When I called him back he states he has one on inner thigh, one on the leg at another spot and 1 on the arm that he did not get infusion in.  They spots are itching.  He has no difficulty breathing.  He was told to take 2 benadryl and then repeat in 6 hours and can do that every 6 hours until bumps and itching goes away.  The night before he comes back next week he should take benadryl 1 tablet that night.  When he comes here next week he will get premeds for the feraheme.  Also told that pt that if he starts having difficulty swallowing or breathing that he should go to ER .  He is agreeable to the plan and on his way to get benadryl.

## 2016-02-27 NOTE — Progress Notes (Signed)
Patient here today as new evaluation regarding anemia.  Hospital follow up.

## 2016-03-02 ENCOUNTER — Other Ambulatory Visit: Payer: Self-pay | Admitting: Internal Medicine

## 2016-03-02 ENCOUNTER — Ambulatory Visit: Payer: 59 | Admitting: Internal Medicine

## 2016-03-05 ENCOUNTER — Telehealth: Payer: Self-pay | Admitting: *Deleted

## 2016-03-05 ENCOUNTER — Inpatient Hospital Stay: Payer: 59

## 2016-03-05 VITALS — BP 119/75 | HR 92 | Temp 97.3°F | Resp 18

## 2016-03-05 DIAGNOSIS — D509 Iron deficiency anemia, unspecified: Secondary | ICD-10-CM | POA: Diagnosis not present

## 2016-03-05 DIAGNOSIS — D649 Anemia, unspecified: Secondary | ICD-10-CM

## 2016-03-05 MED ORDER — SODIUM CHLORIDE 0.9 % IV SOLN
510.0000 mg | Freq: Once | INTRAVENOUS | Status: AC
  Administered 2016-03-05: 510 mg via INTRAVENOUS
  Filled 2016-03-05: qty 17

## 2016-03-05 MED ORDER — DIPHENHYDRAMINE HCL 25 MG PO TABS
50.0000 mg | ORAL_TABLET | Freq: Once | ORAL | Status: DC
  Filled 2016-03-05: qty 2

## 2016-03-05 MED ORDER — DIPHENHYDRAMINE HCL 25 MG PO CAPS
50.0000 mg | ORAL_CAPSULE | Freq: Once | ORAL | Status: AC
  Administered 2016-03-05: 50 mg via ORAL

## 2016-03-05 MED ORDER — SODIUM CHLORIDE 0.9 % IV SOLN
Freq: Once | INTRAVENOUS | Status: AC
  Administered 2016-03-05: 09:00:00 via INTRAVENOUS
  Filled 2016-03-05: qty 1000

## 2016-03-05 NOTE — Telephone Encounter (Signed)
He was checking to see if we have heard about his gi ref.  I looked in computer and did not see anything.  I called and got an appt for 4/11 8:30 to see Tammi Klippel PA for Dr. Rayann Heman.  I called pt and let him know and he will be at [facility] at 8:30. I did tell him the directions and I also faxed in the notes to their office [phone].  Pt was given an earlier appt but he is on a cruise during that time.

## 2016-04-08 ENCOUNTER — Telehealth: Payer: Self-pay | Admitting: *Deleted

## 2016-04-08 NOTE — Telephone Encounter (Signed)
Pt called. He has a colonoscopy/egd schedule on May 3rd. Wants to know if results will be back by his apt on May 12'th.

## 2016-04-09 NOTE — Telephone Encounter (Signed)
Called patient and left message that he should keep his appointment in May as scheduled.

## 2016-04-28 ENCOUNTER — Encounter: Payer: Self-pay | Admitting: Internal Medicine

## 2016-04-29 ENCOUNTER — Telehealth: Payer: Self-pay | Admitting: *Deleted

## 2016-04-29 NOTE — Telephone Encounter (Signed)
RN contacted [facility] GI dept to get final pathology results from 04/21/16 upper/lower scope. Per Jenny Reichmann at [facility], final path is not yet scanned into the computer. Pt had scopes at [facility].  Office will let nurse know to fax report as soon as available.  I explained that pt has an apt with Dr. Rogue Bussing tomorrow.

## 2016-04-30 ENCOUNTER — Inpatient Hospital Stay: Payer: 59 | Attending: Internal Medicine | Admitting: Internal Medicine

## 2016-04-30 VITALS — BP 145/98 | HR 89 | Temp 97.3°F | Resp 18 | Wt 261.2 lb

## 2016-04-30 DIAGNOSIS — E669 Obesity, unspecified: Secondary | ICD-10-CM | POA: Diagnosis not present

## 2016-04-30 DIAGNOSIS — D649 Anemia, unspecified: Secondary | ICD-10-CM

## 2016-04-30 DIAGNOSIS — D509 Iron deficiency anemia, unspecified: Secondary | ICD-10-CM | POA: Insufficient documentation

## 2016-04-30 DIAGNOSIS — D1809 Hemangioma of other sites: Secondary | ICD-10-CM | POA: Insufficient documentation

## 2016-04-30 DIAGNOSIS — Z79899 Other long term (current) drug therapy: Secondary | ICD-10-CM | POA: Insufficient documentation

## 2016-04-30 NOTE — Progress Notes (Signed)
[place] OFFICE PROGRESS NOTE  Patient Care Team: No Pcp Per Patient as PCP - General (General Practice)   SUMMARY OF HEMATOLOGIC HISTORY:  # FEB 2017 SEVERE IRON DEFICIENCY ANEMIA [likley chronic] ? 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 Patient had EGD colonoscopy with Dr. Rayann Heman.   Patient energy levels are significantly improved since the IV iron. Patient continues to be on by mouth iron twice a day. Tolerating it well. Denies any chest pain or shortness of breath or cough. No blood in stools.  REVIEW OF SYSTEMS:  A complete 10 point review of system is done which is negative except mentioned above/history of present illness.   PAST MEDICAL HISTORY :  Past Medical History  Diagnosis Date  . Cough variant asthma   . Obesity   . Anemia   . Liver lesion     PAST SURGICAL HISTORY :   Past Surgical History  Procedure Laterality Date  . Appendectomy    . Hernia repair      2015    FAMILY HISTORY :   Family History  Problem Relation Age of Onset  . Heart attack Mother   . CAD Father   . Hypertension Father     SOCIAL HISTORY:   Social History  Substance Use Topics  . Smoking status: Never Smoker   . Smokeless tobacco: Not on file  . Alcohol Use: No    ALLERGIES:  has No Known Allergies.  MEDICATIONS:  Current Outpatient Prescriptions  Medication Sig Dispense Refill  . albuterol (PROAIR HFA) 108 (90 Base) MCG/ACT inhaler Inhale into the lungs.    . budesonide-formoterol (SYMBICORT) 160-4.5 MCG/ACT inhaler Inhale 2 puffs into the lungs 2 (two) times daily. 1 Inhaler 12  . ferrous sulfate 325 (65 FE) MG tablet Take 1 tablet (325 mg total) by mouth 2 (two) times daily with a  meal. 60 tablet 3  . HYDROcodone-acetaminophen (NORCO/VICODIN) 5-325 MG tablet      No current facility-administered medications for this visit.    PHYSICAL EXAMINATION:   BP 145/98 mmHg  Pulse 89  Temp(Src) 97.3 F (36.3 C) (Tympanic)  Resp 18  Wt 261 lb 3.9 oz (118.5 kg)  Filed Weights   04/30/16 0927  Weight: 261 lb 3.9 oz (118.5 kg)    GENERAL: Well-nourished well-developed; Alert, no distress and comfortable.  Accompanied by wife.  EYES: no pallor or icterus OROPHARYNX: no thrush or ulceration; good dentition  NECK: supple, no masses felt LYMPH:  no palpable lymphadenopathy in the cervical, axillary or inguinal regions LUNGS: clear to auscultation and  No wheeze or crackles HEART/CVS: regular rate & rhythm and no murmurs; No lower extremity edema ABDOMEN:abdomen soft, non-tender and normal bowel sounds Musculoskeletal:no cyanosis of digits and no clubbing  PSYCH: alert & oriented x 3 with fluent speech NEURO: no focal motor/sensory deficits SKIN:  no rashes or significant lesions  LABORATORY DATA:  I have reviewed the data as listed    Component Value Date/Time   NA 139 02/19/2016 0428   K 4.5 02/19/2016 0428   CL 109 02/19/2016 0428   CO2 24 02/19/2016 0428   GLUCOSE 126* 02/19/2016 0428   BUN 11 02/19/2016 0428   CREATININE 0.71 02/19/2016 0428  CALCIUM 8.8* 02/19/2016 0428   PROT 7.1 02/18/2016 1740   ALBUMIN 4.3 02/18/2016 1740   AST 16 02/18/2016 1740   ALT 13* 02/18/2016 1740   ALKPHOS 52 02/18/2016 1740   BILITOT 0.8 02/18/2016 1740   GFRNONAA >60 02/19/2016 0428   GFRAA >60 02/19/2016 0428    No results found for: SPEP, UPEP  Lab Results  Component Value Date   WBC 6.2 02/27/2016   NEUTROABS 3.6 02/27/2016   HGB 10.1* 02/27/2016   HCT 33.6* 02/27/2016   MCV 64.1* 02/27/2016   PLT 232 02/27/2016      Chemistry      Component Value Date/Time   NA 139 02/19/2016 0428   K 4.5 02/19/2016 0428   CL 109 02/19/2016 0428   CO2 24  02/19/2016 0428   BUN 11 02/19/2016 0428   CREATININE 0.71 02/19/2016 0428      Component Value Date/Time   CALCIUM 8.8* 02/19/2016 0428   ALKPHOS 52 02/18/2016 1740   AST 16 02/18/2016 1740   ALT 13* 02/18/2016 1740   BILITOT 0.8 02/18/2016 1740         ASSESSMENT & PLAN:    # SEVERE IRON DEFICIENCY ANEMIA- of unclear etiology.  EGD/colonoscopy few polyps; otherwise no obvious reason for his blood loss. Recommend a capsule study. Paged Dr. Rayann Heman- to discuss. Also as the patient to call GI office regarding capsule study. For now continue by mouth iron.  # [facility]-May 2017 blood work shows hemoglobin 16/ ferritin 54. No plan for any IV iron today. Repeat labs through [facility]. in 3 months; follow-up with me in 6 months.   # Liver lesions- status post biopsy hemangiomas. No further follow-ups recommended; unless clinically necessary.     Cammie Sickle, MD 04/30/2016 9:49 AM

## 2016-05-20 ENCOUNTER — Ambulatory Visit (INDEPENDENT_AMBULATORY_CARE_PROVIDER_SITE_OTHER): Payer: 59

## 2016-05-20 ENCOUNTER — Ambulatory Visit
Admission: EM | Admit: 2016-05-20 | Discharge: 2016-05-20 | Disposition: A | Discharge status: Home / Self Care | Payer: 59 | Attending: Family Medicine | Admitting: Family Medicine

## 2016-05-20 DIAGNOSIS — S60456A Superficial foreign body of right little finger, initial encounter: Secondary | ICD-10-CM | POA: Diagnosis not present

## 2016-05-20 MED ORDER — MUPIROCIN 2 % EX OINT: 1.0000 "application " | TOPICAL_OINTMENT | Freq: Three times a day (TID) | CUTANEOUS | Status: DC

## 2016-05-20 MED ORDER — SULFAMETHOXAZOLE-TRIMETHOPRIM 800-160 MG PO TABS: 1.0000 | ORAL_TABLET | Freq: Two times a day (BID) | ORAL | Status: DC

## 2016-05-20 MED ORDER — TETANUS-DIPHTH-ACELL PERTUSSIS 5-2.5-18.5 LF-MCG/0.5 IM SUSP
0.5000 mL | Freq: Once | INTRAMUSCULAR | Status: AC
  Administered 2016-05-20: 0.5 mL via INTRAMUSCULAR

## 2016-05-20 NOTE — ED Notes (Signed)
Patient has a "chunk" of wood stuck in his middle right finger. Patient states that this occurred a few hours ago at home. Patient states that hand is very painful.

## 2016-05-20 NOTE — ED Provider Notes (Signed)
CSN: [id number]     Arrival date & time 05/20/16  1801 History   First MD Initiated Contact with Patient 05/20/16 2002     Chief Complaint  Patient presents with  . Foreign Body in Skin   (Consider location/radiation/quality/duration/timing/severity/associated sxs/prior Treatment) HPI  35 year old male who today was helping a friend move furniture when he had a splinter of wood become embedded in his middle right finger overlying the DIP joint. States that he continued working and finished helping the friend move pulled the splinter out but felt that there was still retained piece that was hurting more distally. There is been no drainage. He had removed the superficial portion but still thinks that there is more left.   Past Medical History  Diagnosis Date  . Cough variant asthma   . Obesity   . Anemia   . Liver lesion    Past Surgical History  Procedure Laterality Date  . Appendectomy    . Hernia repair      2015   Family History  Problem Relation Age of Onset  . Heart attack Mother   . CAD Father   . Hypertension Father    Social History  Substance Use Topics  . Smoking status: Never Smoker   . Smokeless tobacco: None  . Alcohol Use: 0.0 oz/week    0 Standard drinks or equivalent per week     Comment: occasionally    Review of Systems  Constitutional: Positive for activity change. Negative for fever, chills and fatigue.  All other systems reviewed and are negative.   Allergies  Review of patient's allergies indicates no known allergies.  Home Medications   Prior to Admission medications   Medication Sig Start Date End Date Taking? Authorizing Provider  budesonide-formoterol (SYMBICORT) 160-4.5 MCG/ACT inhaler Inhale 2 puffs into the lungs 2 (two) times daily. 02/19/16  Yes Henreitta Leber, MD  ferrous sulfate 325 (65 FE) MG tablet Take 1 tablet (325 mg total) by mouth 2 (two) times daily with a meal. 02/19/16  Yes Henreitta Leber, MD  albuterol (PROAIR HFA) 108 (90  Base) MCG/ACT inhaler Inhale into the lungs.    Historical Provider, MD  HYDROcodone-acetaminophen (NORCO/VICODIN) 5-325 MG tablet  05/02/14   Historical Provider, MD  mupirocin ointment (BACTROBAN) 2 % Apply 1 application topically 3 (three) times daily. 05/20/16   Lorin Picket, PA-C  sulfamethoxazole-trimethoprim (BACTRIM DS,SEPTRA DS) 800-160 MG tablet Take 1 tablet by mouth 2 (two) times daily. 05/20/16   Lorin Picket, PA-C   Meds Ordered and Administered this Visit   Medications  Tdap (BOOSTRIX) injection 0.5 mL (0.5 mLs Intramuscular Given 05/20/16 2016)    BP 128/90 mmHg  Pulse 111  Temp(Src) 97.5 F (36.4 C) (Oral)  Resp 16  Ht 6\' 1"  (1.854 m)  Wt 250 lb (113.399 kg)  BMI 32.99 kg/m2  SpO2 96% No data found.   Physical Exam  Constitutional: He is oriented to person, place, and time. He appears well-developed and well-nourished. No distress.  HENT:  Head: Normocephalic and atraumatic.  Eyes: Conjunctivae are normal. Pupils are equal, round, and reactive to light.  Neck: Normal range of motion. Neck supple.  Musculoskeletal: Normal range of motion. He exhibits tenderness. He exhibits no edema.  Neurological: He is alert and oriented to person, place, and time.  Skin: He is not diaphoretic.  Examination of the right dominant finger does a small laceration just proximal to the DIP joint while more ulnarly angulated. No active bleeding present.  There is no discoloration deep and no additional foreign body is actually palpable. There is tenderness to palpation. Flexor tendon is fully functional. Sensation is intact distally.  Psychiatric: He has a normal mood and affect. His behavior is normal. Judgment and thought content normal.  Nursing note and vitals reviewed.   ED Course  Procedures (including critical care time)  Labs Review Labs Reviewed - No data to display  Imaging Review Dg Finger Middle Right  05/20/2016  CLINICAL DATA:  Patient has a "chunk" of wood stuck  in his anterior middle right finger. Patient states that this occurred a few hours ago at home. Patient states that hand is very painful. EXAM: RIGHT MIDDLE FINGER 2+V COMPARISON:  None. FINDINGS: No fracture or bone lesion. No bone resorption is seen to suggest osteomyelitis. Joints are normally spaced and aligned. Soft tissues are unremarkable.  No radiopaque foreign body. IMPRESSION: No fracture.  No radiopaque foreign body. Electronically Signed   By: Lajean Manes M.D.   On: 05/20/2016 21:01     Visual Acuity Review  Right Eye Distance:   Left Eye Distance:   Bilateral Distance:    Right Eye Near:   Left Eye Near:    Bilateral Near:     Procedure; the right index finger was infiltrated around the wound with 1% lidocaine plain utilizing approximately 4 mL to achieve anesthesia. After adequate anesthesia the patient went then scrubbed his hand thoroughly for 5 minutes by the clock utilizing surgical scrub brush. The hand was then prepped and draped in the usual fashion and with a sterile technique the wound was explored bluntly and minimally sharply with no foreign body encountered. Dr. Alveta Heimlich also explore the wound again with negative results. After re-cleaning the wound He gave an additional 3 mL of lidocaine for additional anesthesia. The wound was explored deeper more distally and again no foreign body was encountered. The patient tolerated the procedure well. A dry sterile dressing was then applied and surgical netting utilized to maintain a bandage. Signs and symptoms of infection were explained to the patient in detail he was also given printed information with the same instructions. He received a TD. He was advised to elevate the hand tonight. We placed him on Septra DS for 7 days and he will use Atrovent ointment 3 times a day for 2 weeks. If he has any suspect of infection he will return to our office. I told him that if that does occur he may need to be referred to  a hand  surgeon.  Medications  Tdap (BOOSTRIX) injection 0.5 mL (0.5 mLs Intramuscular Given 05/20/16 2016)    MDM   1. Superficial foreign body of right little finger, initial encounter    New Prescriptions   MUPIROCIN OINTMENT (BACTROBAN) 2 %    Apply 1 application topically 3 (three) times daily.   SULFAMETHOXAZOLE-TRIMETHOPRIM (BACTRIM DS,SEPTRA DS) 800-160 MG TABLET    Take 1 tablet by mouth 2 (two) times daily.  Plan: 1. Test/x-ray results and diagnosis reviewed with patient 2. rx as per orders; risks, benefits, potential side effects reviewed with patient 3. Recommend supportive treatment with Elevation to control pain and swelling. Use Bactroban 3 times a day finish all of the antibiotics. Return to clinic if there are any signs or symptoms of infection immediately. 4. F/u prn if symptoms worsen or don't improve     Lorin Picket, PA-C 05/20/16 2140

## 2016-05-20 NOTE — Discharge Instructions (Signed)
Sliver Removal, Care After A sliver--also called a splinter--is a small and thin broken piece of an object that gets stuck (embedded) under the skin. A sliver can create a deep wound that can easily become infected. It is important to care for the wound after a sliver is removed to help prevent infection and other problems from developing. WHAT TO EXPECT AFTER THE PROCEDURE Slivers often break into smaller pieces when they are removed. If pieces of your sliver broke off and stayed in your skin, you will eventually see them working themselves out and you may feel some pain at the wound site. This is normal. HOME CARE INSTRUCTIONS  Keep all follow-up visits as directed by your health care provider. This is important.  There are many different ways to close and cover a wound, including stitches (sutures) and adhesive strips. Follow your health care provider's instructions about:  Wound care.  Bandage (dressing) changes and removal.  Wound closure removal.  Check the wound site every day for signs of infection. Watch for:  Red streaks coming from the wound.  Fever.  Redness or tenderness around the wound.  Fluid, blood, or pus coming from the wound.  A bad smell coming from the wound. SEEK MEDICAL CARE IF:  You think that a piece of the sliver is still in your skin.  Your wound was closed, as with sutures, and the edges of the wound break open.  You have signs of infection, including:  New or worsening redness around the wound.  New or worsening tenderness around the wound.  Fluid, blood, or pus coming from the wound.  A bad smell coming from the wound or dressing. SEEK IMMEDIATE MEDICAL CARE IF: You have any of the following signs of infection:  Red streaks coming from the wound.  An unexplained fever.   This information is not intended to replace advice given to you by your health care provider. Make sure you discuss any questions you have with your health care  provider.   Document Released: 12/03/2000 Document Revised: 12/27/2014 Document Reviewed: 08/08/2014 Elsevier Interactive Patient Education [employer].

## 2016-08-17 ENCOUNTER — Encounter: Payer: Self-pay | Admitting: Internal Medicine

## 2016-08-20 ENCOUNTER — Telehealth: Payer: Self-pay | Admitting: Internal Medicine

## 2016-08-20 NOTE — Telephone Encounter (Signed)
Please inform patient that labs are normal; no IV iron recommended follow up as planned.

## 2016-08-24 NOTE — Telephone Encounter (Signed)
Left vm for patient.  No IV iron needed at this time. Keep all standing f/u and lab check apts.

## 2016-10-23 IMAGING — CT CT CHEST W/O CM
2 of 3 series · 17 of 46 positions shown, 19 images · non-contrast
Comparison: None.

CLINICAL DATA: Cough x4 weeks

EXAM:
CT CHEST WITHOUT CONTRAST
TECHNIQUE: Multidetector CT imaging of the chest was performed following the
standard protocol without IV contrast.

[Series 2: routine chest wo · axial · 0.80mm/px · z∈[-639,-354]mm · 14 of 67 slices shown, 16 images]
[im 5/67  soft-tissue]
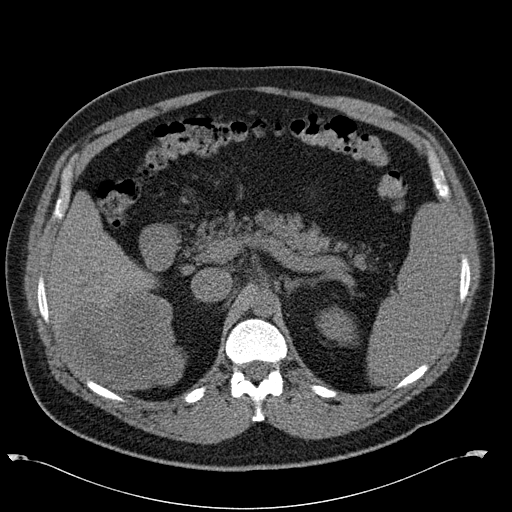
[im 5/67  bone]
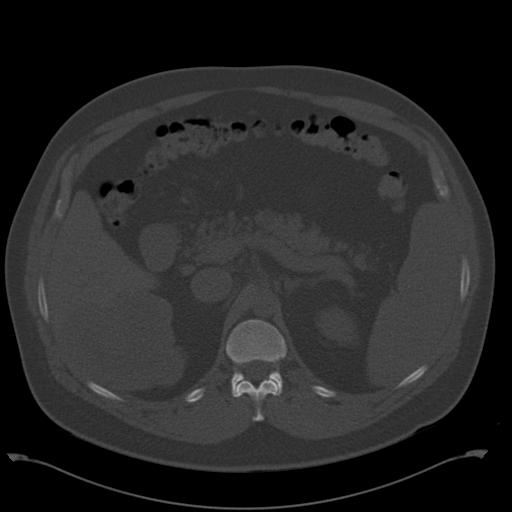
[im 9/67  soft-tissue]
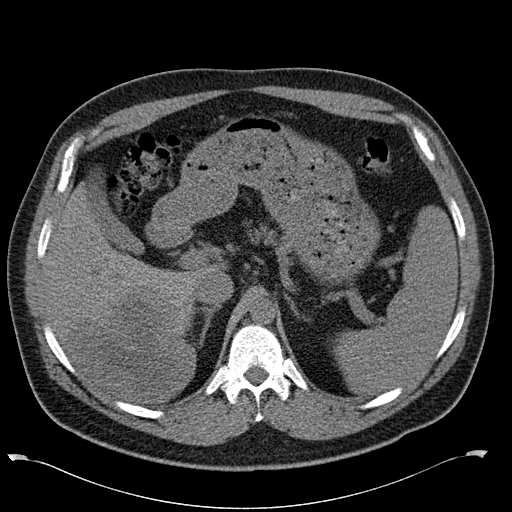
[im 13/67  soft-tissue]
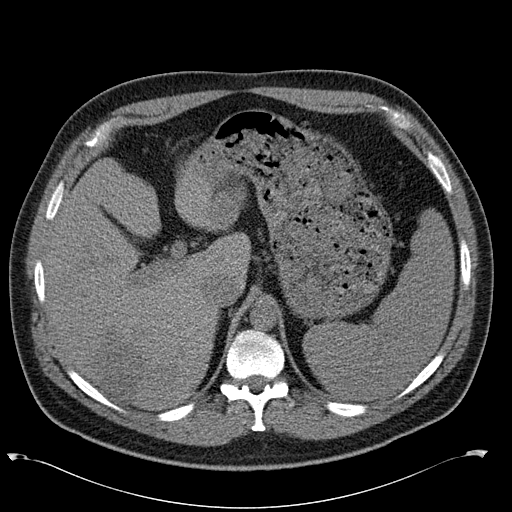
[im 18/67  soft-tissue]
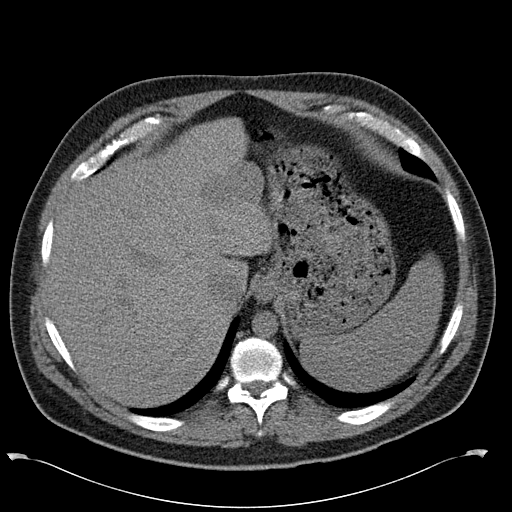
[im 22/67  soft-tissue]
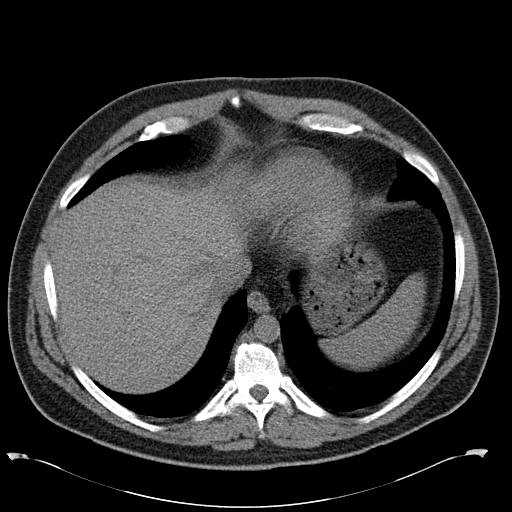
[im 26/67  soft-tissue]
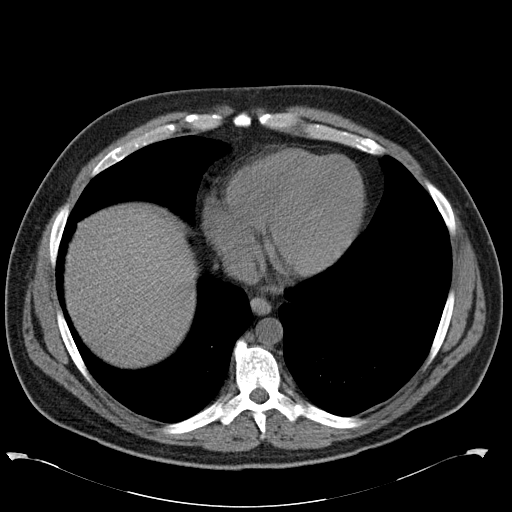
[im 30/67  soft-tissue]
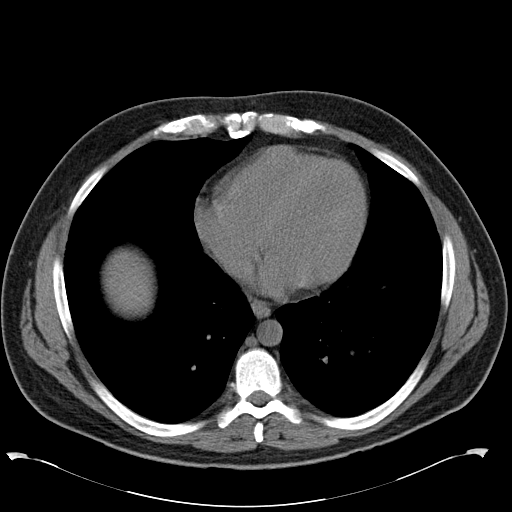
[im 37/67  soft-tissue]
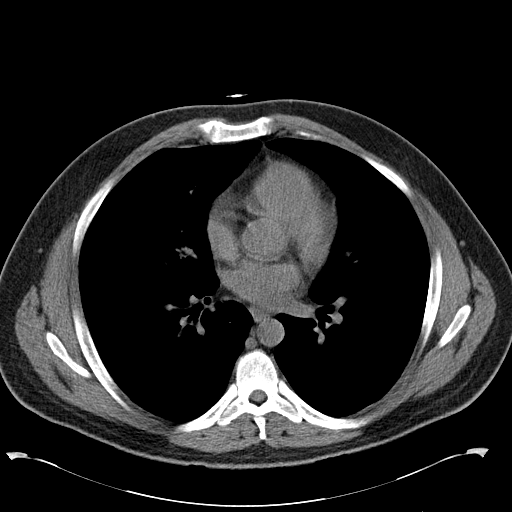
[im 41/67  soft-tissue]
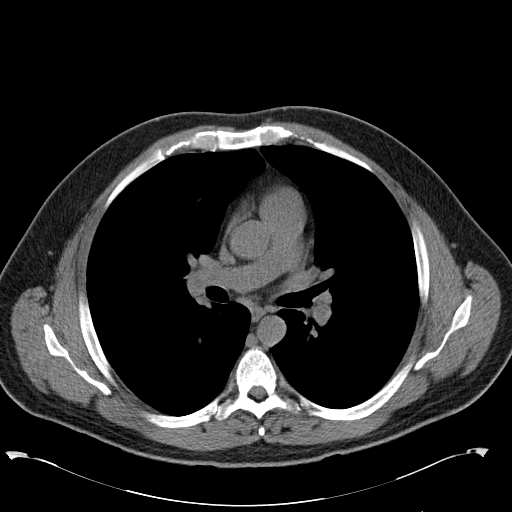
[im 41/67  bone]
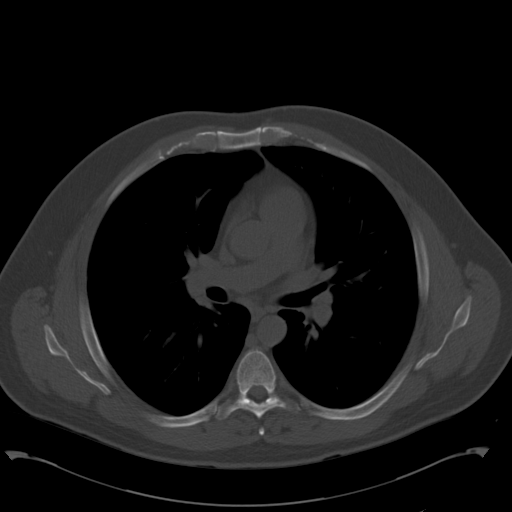
[im 45/67  soft-tissue]
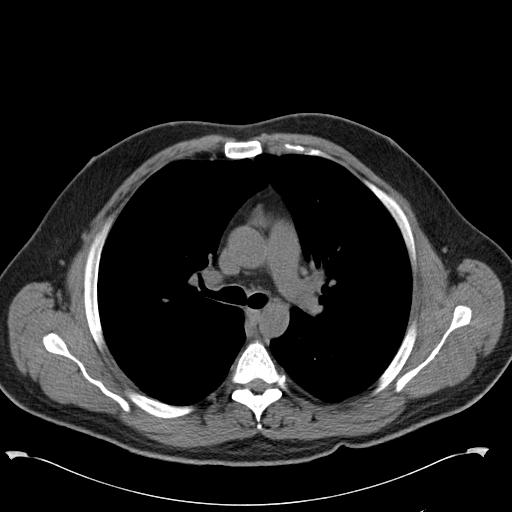
[im 49/67  soft-tissue]
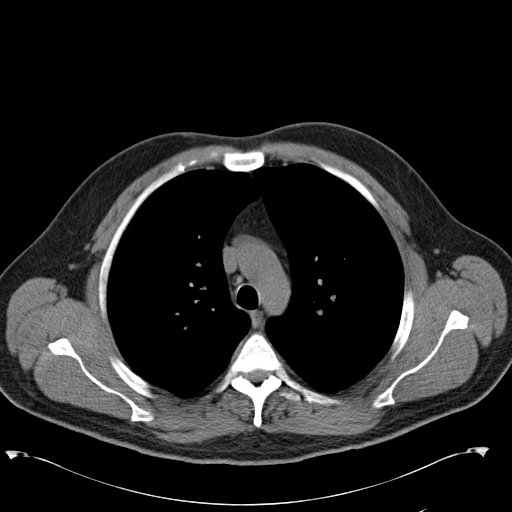
[im 54/67  soft-tissue]
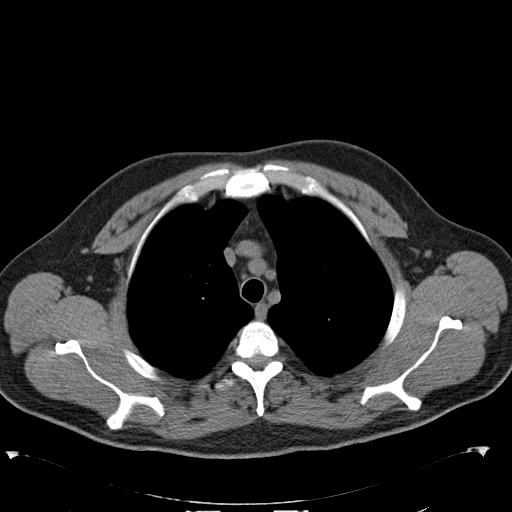
[im 58/67  soft-tissue]
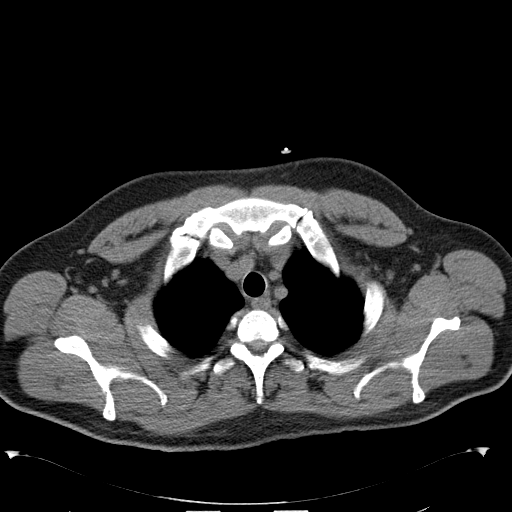
[im 62/67  soft-tissue]
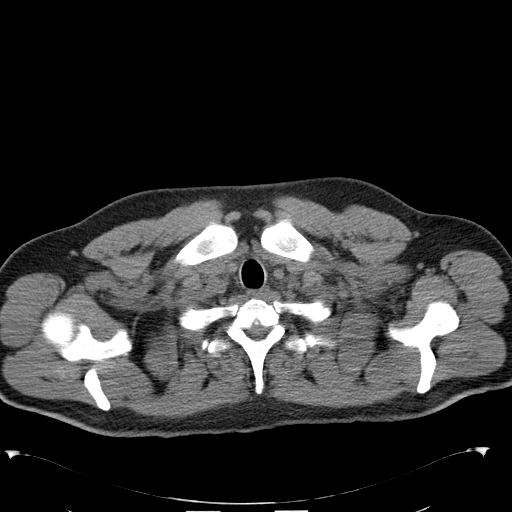

[Series 5: routine chest wo cor · coronal · 0.77mm/px · 3 of 156 slices shown]
[im 52/156  soft-tissue]
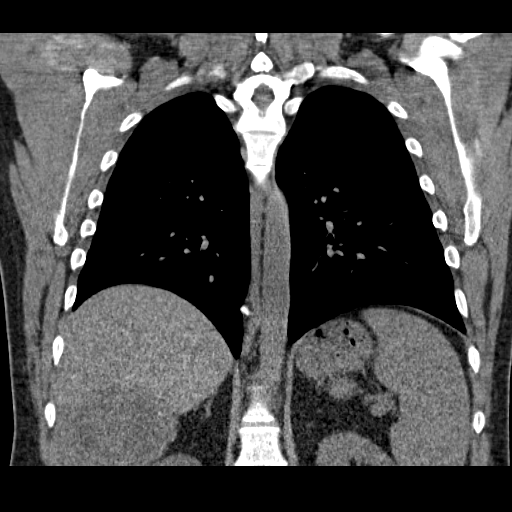
[im 69/156  soft-tissue]
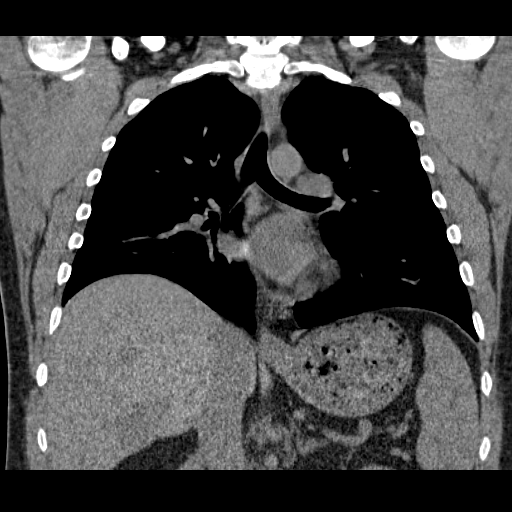
[im 87/156  soft-tissue]
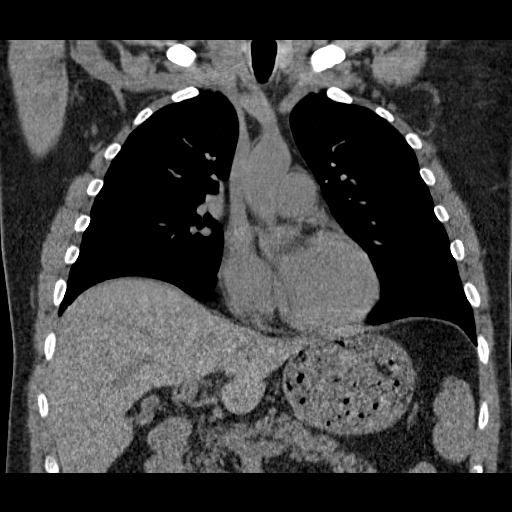

[17 of 46 positions shown; findings below may reference images not displayed]

FINDINGS: Mediastinum/Lymph Nodes: No masses or pathologically enlarged lymph
nodes identified on this un-enhanced exam. No pericardial effusion.

Lungs/Pleura: Patchy airspace opacities in the superior and
posterior basal segments left lower lobe. Right lung clear. No
pleural effusion.

Upper abdomen: [DATE] x 8 cm low-attenuation lobular partially
exophytic mass in hepatic segment 6, incompletely visualized. 5.6 x
3.8 cm low attenuation partially exophytic mass from hepatic segment
3.

Musculoskeletal: No chest wall mass or suspicious bone lesions
identified. Minimal spurring in the lower thoracic spine.
IMPRESSION: 1. Patchy left lower lobe airspace opacities suggesting pneumonia.
2. Large liver masses, possibly benign hemangiomas but nonspecific.
Recommend MR liver with contrast for further possibly definitive
characterization.
These results will be called to the ordering clinician or
representative by the Radiologist Assistant, and communication
documented in the PACS or zVision Dashboard.

## 2016-10-27 ENCOUNTER — Encounter: Payer: Self-pay | Admitting: Internal Medicine

## 2016-11-01 ENCOUNTER — Inpatient Hospital Stay: Payer: 59 | Attending: Internal Medicine | Admitting: Internal Medicine

## 2016-11-01 VITALS — BP 139/99 | HR 92 | Temp 97.0°F | Resp 18 | Wt 273.8 lb

## 2016-11-01 DIAGNOSIS — D1803 Hemangioma of intra-abdominal structures: Secondary | ICD-10-CM | POA: Diagnosis not present

## 2016-11-01 DIAGNOSIS — E669 Obesity, unspecified: Secondary | ICD-10-CM | POA: Insufficient documentation

## 2016-11-01 DIAGNOSIS — D509 Iron deficiency anemia, unspecified: Secondary | ICD-10-CM | POA: Diagnosis present

## 2016-11-01 DIAGNOSIS — Z79899 Other long term (current) drug therapy: Secondary | ICD-10-CM | POA: Insufficient documentation

## 2016-11-01 DIAGNOSIS — D649 Anemia, unspecified: Secondary | ICD-10-CM

## 2016-11-01 NOTE — Progress Notes (Signed)
[facility] OFFICE PROGRESS NOTE  Patient Care Team: No Pcp Per Patient as PCP - General (General Practice)   SUMMARY OF HEMATOLOGIC HISTORY:  # FEB 2017 SEVERE IRON DEFICIENCY ANEMIA [likley chronic] ? 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  Approximately month ago he noted to have increasing/diarrhea abnormal discomfort; especially with bowel movements; cramping. Currently resolved. Awaiting a GI evaluation December 2017.   Patient energy levels are significantly improved since the IV iron. Patient continues to be on by mouth once a day. Tolerating it well. Denies any chest pain or shortness of breath or cough. No blood in stools.   REVIEW OF SYSTEMS:  A complete 10 point review of system is done which is negative except mentioned above/history of present illness.   PAST MEDICAL HISTORY :  Past Medical History:  Diagnosis Date  . Anemia   . Cough variant asthma   . Liver lesion   . Obesity     PAST SURGICAL HISTORY :   Past Surgical History:  Procedure Laterality Date  . APPENDECTOMY    . HERNIA REPAIR     2015    FAMILY HISTORY :   Family History  Problem Relation Age of Onset  . Heart attack Mother   . CAD Father   . Hypertension Father     SOCIAL HISTORY:   Social History  Substance Use Topics  . Smoking status: Never Smoker  . Smokeless tobacco: Not on file  . Alcohol use 0.0 oz/week     Comment: occasionally    ALLERGIES:  has No Known Allergies.  MEDICATIONS:  Current Outpatient Prescriptions  Medication Sig Dispense Refill  . albuterol (PROAIR HFA) 108 (90 Base) MCG/ACT inhaler Inhale into the lungs.    .  budesonide-formoterol (SYMBICORT) 160-4.5 MCG/ACT inhaler Inhale 2 puffs into the lungs 2 (two) times daily. 1 Inhaler 12  . ferrous sulfate 325 (65 FE) MG tablet Take 1 tablet (325 mg total) by mouth 2 (two) times daily with a meal. 60 tablet 3   No current facility-administered medications for this visit.     PHYSICAL EXAMINATION:   BP (!) 139/99 (BP Location: Right Arm, Patient Position: Sitting)   Pulse 92   Temp 97 F (36.1 C) (Tympanic)   Resp 18   Wt 273 lb 12.8 oz (124.2 kg)   BMI 36.12 kg/m   Filed Weights   11/01/16 1031  Weight: 273 lb 12.8 oz (124.2 kg)    GENERAL: Well-nourished well-developed; Alert, no distress and comfortable. His alone.  EYES: no pallor or icterus OROPHARYNX: no thrush or ulceration; good dentition  NECK: supple, no masses felt LYMPH:  no palpable lymphadenopathy in the cervical, axillary or inguinal regions LUNGS: clear to auscultation and  No wheeze or crackles HEART/CVS: regular rate & rhythm and no murmurs; No lower extremity edema ABDOMEN:abdomen soft, non-tender and normal bowel sounds Musculoskeletal:no cyanosis of digits and no clubbing  PSYCH: alert & oriented x 3 with fluent speech NEURO: no focal motor/sensory deficits SKIN:  no rashes or significant lesions  LABORATORY DATA:  I have reviewed the data as listed    Component Value Date/Time   NA 139 02/19/2016 0428  K 4.5 02/19/2016 0428   CL 109 02/19/2016 0428   CO2 24 02/19/2016 0428   GLUCOSE 126 (H) 02/19/2016 0428   BUN 11 02/19/2016 0428   CREATININE 0.71 02/19/2016 0428   CALCIUM 8.8 (L) 02/19/2016 0428   PROT 7.1 02/18/2016 1740   ALBUMIN 4.3 02/18/2016 1740   AST 16 02/18/2016 1740   ALT 13 (L) 02/18/2016 1740   ALKPHOS 52 02/18/2016 1740   BILITOT 0.8 02/18/2016 1740   GFRNONAA >60 02/19/2016 0428   GFRAA >60 02/19/2016 0428    No results found for: SPEP, UPEP  Lab Results  Component Value Date   WBC 6.2 02/27/2016   NEUTROABS 3.6 02/27/2016   HGB  10.1 (L) 02/27/2016   HCT 33.6 (L) 02/27/2016   MCV 64.1 (L) 02/27/2016   PLT 232 02/27/2016      Chemistry      Component Value Date/Time   NA 139 02/19/2016 0428   K 4.5 02/19/2016 0428   CL 109 02/19/2016 0428   CO2 24 02/19/2016 0428   BUN 11 02/19/2016 0428   CREATININE 0.71 02/19/2016 0428      Component Value Date/Time   CALCIUM 8.8 (L) 02/19/2016 0428   ALKPHOS 52 02/18/2016 1740   AST 16 02/18/2016 1740   ALT 13 (L) 02/18/2016 1740   BILITOT 0.8 02/18/2016 1740         ASSESSMENT & PLAN:   Symptomatic anemia # SEVERE IRON DEFICIENCY ANEMIA- of unclear etiology.  EGD/colonoscopy few polyps; otherwise no obvious reason for his blood loss.No capsule study. For now continue by mouth iron.  # [facility]-Nov 2017 blood work shows hemoglobin 16/ ferritin 56.   # Liver lesions- status post biopsy hemangiomas. No further follow-ups recommended; unless clinically necessary.  # diarrhea- improved; rule out infectious etiology- asked to call us if happens again. GI appt in dec 2017.   # follow up in 6 months/ [facility]- few days prior.      Cammie Sickle, MD 11/01/2016 4:13 PM

## 2016-11-01 NOTE — Progress Notes (Signed)
Patient is here for follow up, he is doing well no complaints  

## 2016-11-01 NOTE — Assessment & Plan Note (Signed)
#   SEVERE IRON DEFICIENCY ANEMIA- of unclear etiology.  EGD/colonoscopy few polyps; otherwise no obvious reason for his blood loss.No capsule study. For now continue by mouth iron.  # [facility]-Nov 2017 blood work shows hemoglobin 16/ ferritin 56.   # Liver lesions- status post biopsy hemangiomas. No further follow-ups recommended; unless clinically necessary.  # diarrhea- improved; rule out infectious etiology- asked to call us if happens again. GI appt in dec 2017.   # follow up in 6 months/ [facility]- few days prior.

## 2016-12-16 ENCOUNTER — Other Ambulatory Visit
Admission: RE | Admit: 2016-12-16 | Discharge: 2016-12-16 | Disposition: A | Discharge status: Home / Self Care | Payer: 59 | Source: Ambulatory Visit | Attending: Student | Admitting: Student

## 2016-12-16 DIAGNOSIS — D509 Iron deficiency anemia, unspecified: Secondary | ICD-10-CM | POA: Insufficient documentation

## 2016-12-16 DIAGNOSIS — R194 Change in bowel habit: Secondary | ICD-10-CM | POA: Insufficient documentation

## 2017-01-06 LAB — MISCELLANEOUS TEST

## 2017-05-02 ENCOUNTER — Inpatient Hospital Stay: Payer: 59 | Admitting: Internal Medicine

## 2017-05-07 ENCOUNTER — Ambulatory Visit
Admission: EM | Admit: 2017-05-07 | Discharge: 2017-05-07 | Disposition: A | Discharge status: Home / Self Care | Payer: 59 | Attending: Internal Medicine | Admitting: Internal Medicine

## 2017-05-07 ENCOUNTER — Encounter: Payer: Self-pay | Admitting: *Deleted

## 2017-05-07 DIAGNOSIS — R6889 Other general symptoms and signs: Secondary | ICD-10-CM

## 2017-05-07 LAB — RAPID STREP SCREEN (MED CTR MEBANE ONLY): STREPTOCOCCUS, GROUP A SCREEN (DIRECT): NEGATIVE

## 2017-05-07 MED ORDER — OSELTAMIVIR PHOSPHATE 75 MG PO CAPS: 75.0000 mg | ORAL_CAPSULE | Freq: Two times a day (BID) | ORAL | 0 refills | Status: DC

## 2017-05-07 NOTE — ED Provider Notes (Signed)
CSN: [id number]     Arrival date & time 05/07/17  1030 History   First MD Initiated Contact with Patient 05/07/17 1111     Chief Complaint  Patient presents with  . Fever  . Sore Throat  . Headache  . Generalized Body Aches  . Chills   (Consider location/radiation/quality/duration/timing/severity/associated sxs/prior Treatment) HPI  This a 36 year old male who presents with the sudden onset 2 days ago with sore throat headache fever chills and body aches. He states his fever has been up to 101. Also having periods of sweating and coldness. He has had a mild cough although that is not a predominant feature. He received his flu shot in October or November.         Past Medical History:  Diagnosis Date  . Anemia   . Cough variant asthma   . Liver lesion   . Obesity    Past Surgical History:  Procedure Laterality Date  . APPENDECTOMY    . HERNIA REPAIR     2015   Family History  Problem Relation Age of Onset  . Heart attack Mother   . CAD Father   . Hypertension Father    Social History  Substance Use Topics  . Smoking status: Never Smoker  . Smokeless tobacco: Never Used  . Alcohol use 0.0 oz/week     Comment: occasionally    Review of Systems  Constitutional: Positive for activity change. Negative for appetite change, chills, fatigue and fever.  HENT: Positive for congestion, postnasal drip, rhinorrhea and sore throat.   Respiratory: Positive for cough. Negative for wheezing and stridor.   All other systems reviewed and are negative.   Allergies  Patient has no known allergies.  Home Medications   Prior to Admission medications   Medication Sig Start Date End Date Taking? Authorizing Provider  ferrous sulfate 325 (65 FE) MG tablet Take 1 tablet (325 mg total) by mouth 2 (two) times daily with a meal. 02/19/16  Yes Sainani, Belia Heman, MD  oseltamivir (TAMIFLU) 75 MG capsule Take 1 capsule (75 mg total) by mouth every 12 (twelve) hours. 05/07/17   Lorin Picket, PA-C   Meds Ordered and Administered this Visit  Medications - No data to display  BP 139/90 (BP Location: Left Arm)   Pulse (!) 110   Temp 98.8 F (37.1 C) (Oral)   Resp 16   Ht 6\' 1"  (1.854 m)   Wt 260 lb (117.9 kg)   SpO2 97%   BMI 34.30 kg/m  No data found.   Physical Exam  Constitutional: He is oriented to person, place, and time. He appears well-developed and well-nourished. No distress.  HENT:  Head: Normocephalic.  Right Ear: External ear normal.  Left Ear: External ear normal.  Nose: Nose normal.  Mouth/Throat: Oropharynx is clear and moist. No oropharyngeal exudate.  Eyes: Pupils are equal, round, and reactive to light. Right eye exhibits no discharge. Left eye exhibits no discharge.  Neck: Normal range of motion.  Pulmonary/Chest: Effort normal and breath sounds normal. No respiratory distress. He has no wheezes. He has no rales.  Musculoskeletal: Normal range of motion.  Neurological: He is alert and oriented to person, place, and time.  Skin: Skin is warm and dry. He is not diaphoretic.  Psychiatric: He has a normal mood and affect. His behavior is normal. Judgment and thought content normal.  Nursing note and vitals reviewed.   Urgent Care Course     Procedures (including critical care time)  Labs Review Labs Reviewed  RAPID STREP SCREEN (NOT AT [facility])  CULTURE, GROUP A STREP [facility])    Imaging Review No results found.   Visual Acuity Review  Right Eye Distance:   Left Eye Distance:   Bilateral Distance:    Right Eye Near:   Left Eye Near:    Bilateral Near:         MDM   1. Flu-like symptoms    Discharge Medication List as of 05/07/2017 11:31 AM    START taking these medications   Details  oseltamivir (TAMIFLU) 75 MG capsule Take 1 capsule (75 mg total) by mouth every 12 (twelve) hours., Starting Sat 05/07/2017, Normal      Plan: 1. Test/x-ray results and diagnosis reviewed with patient 2. rx as per orders; risks,  benefits, potential side effects reviewed with patient 3. Recommend supportive treatment with Rest and fluids. Use Motrin Tylenol for fever or body aches. Patient's tachycardia is more consistent with the flulike symptoms then how he actually appears today. Along with his high fever. He does prefer to use Tamiflu in effort to make him feel better quicker since he has a large project at work that he needs to complete. He will rest over the weekend and should probably be able to return to work on Monday if he is afebrile. 4. F/u prn if symptoms worsen or don't improve     Lorin Picket, PA-C 05/07/17 1136

## 2017-05-07 NOTE — ED Triage Notes (Signed)
Sore throat, headache, fever, chills, and body aches, since Thursday.

## 2017-05-10 LAB — CULTURE, GROUP A STREP (THRC)

## 2017-06-17 ENCOUNTER — Telehealth: Payer: Self-pay | Admitting: *Deleted

## 2017-06-17 NOTE — Telephone Encounter (Signed)
Attempted to contact patient 1147 am - unable to leave vm as vm not set up.

## 2017-06-17 NOTE — Telephone Encounter (Signed)
Our records indicate that he is a [employer] employee, he was given a [facility] order requisition form at his last appt.    If he needs a new one, he can let us know which location he will be going to and an order form can be faxed there.

## 2017-06-17 NOTE — Telephone Encounter (Signed)
He lost his forms and would like to stop by office to pick up new form for lab draw

## 2017-06-17 NOTE — Telephone Encounter (Signed)
Patient called and states he has an appt Thursday to see md, Asking about orders for a lab appt to be done prior to seeing md, Please call hime back [phone]

## 2017-06-17 NOTE — Telephone Encounter (Signed)
Advised pt that order form was ready, order faxed to [facility] on [address] fax [phone] as requested by the pt.

## 2017-06-21 ENCOUNTER — Encounter: Payer: Self-pay | Admitting: Internal Medicine

## 2017-06-23 ENCOUNTER — Inpatient Hospital Stay: Payer: 59 | Attending: Internal Medicine | Admitting: Internal Medicine

## 2017-06-23 VITALS — BP 125/86 | HR 98 | Temp 97.0°F | Wt 254.1 lb

## 2017-06-23 DIAGNOSIS — D509 Iron deficiency anemia, unspecified: Secondary | ICD-10-CM | POA: Diagnosis not present

## 2017-06-23 DIAGNOSIS — E669 Obesity, unspecified: Secondary | ICD-10-CM | POA: Diagnosis not present

## 2017-06-23 DIAGNOSIS — Z79899 Other long term (current) drug therapy: Secondary | ICD-10-CM | POA: Insufficient documentation

## 2017-06-23 DIAGNOSIS — D649 Anemia, unspecified: Secondary | ICD-10-CM

## 2017-06-23 DIAGNOSIS — D1803 Hemangioma of intra-abdominal structures: Secondary | ICD-10-CM | POA: Insufficient documentation

## 2017-06-23 NOTE — Assessment & Plan Note (Addendum)
#   SEVERE IRON DEFICIENCY ANEMIA- of unclear etiology.  EGD/colonoscopy "polyps"; otherwise no obvious reason for his blood loss. Capsule study- NEG.  .  # Most recent lab work from [facility]-06/21/2017 blood work shows hemoglobin 16/ ferritin 45.7. For now continue by mouth iron  # Multiple "polyps" [inflammatory] on colo/EGD- May 2017- ? Juvenile [grandma- dad/sister- stomach? Late; one sister- 33y; dad/mom/ uncles-aunts- no cancers].   # Liver lesions- status post biopsy hemangiomas. No further follow-ups recommended; unless clinically necessary.  # follow up in 6 months/ [facility]- in 3 months/ 6 months.   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 Discussed with GI; Ms.Michelle Wynetta Emery- regarding my concerns for workup/genetic testing for juvenile polyposis syndrome; plans to discuss with Dr.Elliot. Will await call from [facility]; and then proceed with genetic testing if indicated.

## 2017-06-23 NOTE — Progress Notes (Signed)
[facility] OFFICE PROGRESS NOTE  Patient Care Team: Patient, No Pcp Per as PCP - General (General Practice)   SUMMARY OF HEMATOLOGIC HISTORY:  # FEB 2017 SEVERE IRON DEFICIENCY ANEMIA [likley chronic] ? Etiology on IV iron [facility] 2017- EGD/COLO-multiple polpys/ but not clear source of bleeding; Dr.Rein]; s/p  Capsule study [jan 2018]  # FEB 2017 liver lesions [incidental] s/p Bx- hemagiomas  INTERVAL HISTORY:  A very pleasant 36 year old male patient severe iron deficiency anemia status post IV iron- is currently up for follow-up; currently on by mouth iron once a day.  The interim patient underwent GI evaluation- capsule study. Was negative. He also had some diarrhea a few months ago- he was treated with oral steroids. Currently resolved.  Patient continues to be on by mouth once a day- "most of the times"Tolerating it well. Denies any chest pain or shortness of breath or cough. No blood in stools.   REVIEW OF SYSTEMS:  A complete 10 point review of system is done which is negative except mentioned above/history of present illness.   PAST MEDICAL HISTORY :  Past Medical History:  Diagnosis Date  . Anemia   . Cough variant asthma   . Liver lesion   . Obesity     PAST SURGICAL HISTORY :   Past Surgical History:  Procedure Laterality Date  . APPENDECTOMY    . HERNIA REPAIR     2015    FAMILY HISTORY :   Family History  Problem Relation Age of Onset  . Heart attack Mother   . CAD Father   . Hypertension Father     SOCIAL HISTORY:   Social History  Substance Use Topics  . Smoking status: Never Smoker  . Smokeless tobacco: Never Used  . Alcohol use 0.0 oz/week     Comment: occasionally    ALLERGIES:  has No Known Allergies.  MEDICATIONS:  Current Outpatient Prescriptions  Medication Sig Dispense Refill  . ferrous sulfate 325 (65 FE) MG tablet Take 1 tablet (325 mg total) by mouth 2 (two) times daily with a meal. 60 tablet 3   No current  facility-administered medications for this visit.     PHYSICAL EXAMINATION:   BP 125/86 (BP Location: Left Arm, Patient Position: Sitting)   Pulse 98   Temp (!) 97 F (36.1 C) (Tympanic)   Wt 254 lb 2 oz (115.3 kg)   BMI 33.53 kg/m   Filed Weights   06/23/17 1043  Weight: 254 lb 2 oz (115.3 kg)    GENERAL: Well-nourished well-developed; Alert, no distress and comfortable. His alone.  EYES: no pallor or icterus OROPHARYNX: no thrush or ulceration; good dentition  NECK: supple, no masses felt LYMPH:  no palpable lymphadenopathy in the cervical, axillary or inguinal regions LUNGS: clear to auscultation and  No wheeze or crackles HEART/CVS: regular rate & rhythm and no murmurs; No lower extremity edema ABDOMEN:abdomen soft, non-tender and normal bowel sounds Musculoskeletal:no cyanosis of digits and no clubbing  PSYCH: alert & oriented x 3 with fluent speech NEURO: no focal motor/sensory deficits SKIN:  no rashes or significant lesions  LABORATORY DATA:  I have reviewed the data as listed    Component Value Date/Time   NA 139 02/19/2016 0428   K 4.5 02/19/2016 0428   CL 109 02/19/2016 0428   CO2 24 02/19/2016 0428   GLUCOSE 126 (H) 02/19/2016 0428   BUN 11 02/19/2016 0428   CREATININE 0.71 02/19/2016 0428   CALCIUM 8.8 (L) 02/19/2016 3790  PROT 7.1 02/18/2016 1740   ALBUMIN 4.3 02/18/2016 1740   AST 16 02/18/2016 1740   ALT 13 (L) 02/18/2016 1740   ALKPHOS 52 02/18/2016 1740   BILITOT 0.8 02/18/2016 1740   GFRNONAA >60 02/19/2016 0428   GFRAA >60 02/19/2016 0428    No results found for: SPEP, UPEP  Lab Results  Component Value Date   WBC 6.2 02/27/2016   NEUTROABS 3.6 02/27/2016   HGB 10.1 (L) 02/27/2016   HCT 33.6 (L) 02/27/2016   MCV 64.1 (L) 02/27/2016   PLT 232 02/27/2016      Chemistry      Component Value Date/Time   NA 139 02/19/2016 0428   K 4.5 02/19/2016 0428   CL 109 02/19/2016 0428   CO2 24 02/19/2016 0428   BUN 11 02/19/2016 0428    CREATININE 0.71 02/19/2016 0428      Component Value Date/Time   CALCIUM 8.8 (L) 02/19/2016 0428   ALKPHOS 52 02/18/2016 1740   AST 16 02/18/2016 1740   ALT 13 (L) 02/18/2016 1740   BILITOT 0.8 02/18/2016 1740         ASSESSMENT & PLAN:   Symptomatic anemia # SEVERE IRON DEFICIENCY ANEMIA- of unclear etiology.  EGD/colonoscopy "polyps"; otherwise no obvious reason for his blood loss. Capsule study- NEG.  .  # Most recent lab work from [facility]-06/21/2017 blood work shows hemoglobin 16/ ferritin 45.7. For now continue by mouth iron  # Multiple "polyps" [inflammatory] on colo/EGD- May 2017- ? Juvenile [grandma- dad/sister- stomach? Late; one sister- 33y; dad/mom/ uncles-aunts- no cancers].   # Liver lesions- status post biopsy hemangiomas. No further follow-ups recommended; unless clinically necessary.  # follow up in 6 months/ [facility]- in 3 months/ 6 months.   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 Discussed with GI; Ms.Michelle Wynetta Emery- regarding my concerns for workup/genetic testing for juvenile polyposis syndrome; plans to discuss with Dr.Elliot. Will await call from [facility]; and then proceed with genetic testing if indicated.      Cammie Sickle, MD 07/19/2017 1:16 PM

## 2017-06-23 NOTE — Progress Notes (Signed)
Patient here today for follow up.   

## 2017-09-14 ENCOUNTER — Telehealth: Payer: Self-pay | Admitting: *Deleted

## 2017-09-14 NOTE — Telephone Encounter (Signed)
Per Dr. Rogue Bussing, patient will need to find a PCP for this - since we only treat his anemia, and only see him for a visit every 6 months or so.

## 2017-09-14 NOTE — Telephone Encounter (Signed)
Patient called asking if Dr B would be willing to sign a form for him stating that he is on an appropriate weight loss program ( he is on weight watchers) and that he is trying to lose weight, (he has lost 30#) so that he can get incentive for benefits at his employer. Please advise if willing to do this as he has no PCP

## 2017-09-14 NOTE — Telephone Encounter (Signed)
Patient informed of doctor decision and states he understands

## 2017-12-22 ENCOUNTER — Inpatient Hospital Stay: Payer: 59 | Admitting: Internal Medicine

## 2018-01-04 ENCOUNTER — Inpatient Hospital Stay: Payer: 59 | Attending: Internal Medicine | Admitting: Internal Medicine

## 2018-01-04 ENCOUNTER — Encounter: Payer: Self-pay | Admitting: Internal Medicine

## 2018-01-04 ENCOUNTER — Inpatient Hospital Stay: Payer: 59

## 2018-01-04 VITALS — BP 130/87 | HR 93 | Temp 98.0°F | Resp 16 | Wt 262.0 lb

## 2018-01-04 DIAGNOSIS — Z86010 Personal history of colonic polyps: Secondary | ICD-10-CM | POA: Insufficient documentation

## 2018-01-04 DIAGNOSIS — E669 Obesity, unspecified: Secondary | ICD-10-CM | POA: Insufficient documentation

## 2018-01-04 DIAGNOSIS — D509 Iron deficiency anemia, unspecified: Secondary | ICD-10-CM | POA: Insufficient documentation

## 2018-01-04 DIAGNOSIS — D1803 Hemangioma of intra-abdominal structures: Secondary | ICD-10-CM | POA: Insufficient documentation

## 2018-01-04 DIAGNOSIS — D649 Anemia, unspecified: Secondary | ICD-10-CM

## 2018-01-04 DIAGNOSIS — Z79899 Other long term (current) drug therapy: Secondary | ICD-10-CM | POA: Diagnosis not present

## 2018-01-04 NOTE — Assessment & Plan Note (Addendum)
#   SEVERE IRON DEFICIENCY ANEMIA- of unclear etiology.  EGD/colonoscopy "polyps"; otherwise no obvious reason for his blood loss. Capsule study- NEG.    # Most recent lab work from [facility]-01/02/2018- hemoglobin 16.4/ ferritin 56; iron sat- 56%.  Improved.  # Multiple "polyps" [inflammatory] on colo/EGD- May 2017- ? Juvenile [grandma- dad/sister- stomach? Late; one sister- 33y; dad/mom/ uncles-aunts- no cancers].  Recommend genetic testing.  # Liver lesions- status post biopsy hemangiomas. No further follow-ups recommended; unless clinically necessary.  Follow up in 12 months/labs [facility].

## 2018-01-04 NOTE — Progress Notes (Signed)
[place] OFFICE PROGRESS NOTE  Patient Care Team: Patient, No Pcp Per as PCP - General (General Practice)   SUMMARY OF HEMATOLOGIC HISTORY:  # FEB 2017 SEVERE IRON DEFICIENCY ANEMIA [likley chronic] ? Etiology on IV iron [facility] 2017- EGD/COLO-multiple polpys/ but not clear source of bleeding; Dr.Rein]; s/p  Capsule study [jan 2018]  # FEB 2017 liver lesions [incidental] s/p Bx- hemagiomas  INTERVAL HISTORY:  A very pleasant 37 year old male patient severe iron deficiency anemia status post IV iron- is currently up for follow-up.  Patient continues to be on by mouth once a day- "most of the times". Tolerating it well. Denies any chest pain or shortness of breath or cough. No blood in stools.   REVIEW OF SYSTEMS:  A complete 10 point review of system is done which is negative except mentioned above/history of present illness.   PAST MEDICAL HISTORY :  Past Medical History:  Diagnosis Date  . Anemia   . Cough variant asthma   . Family history of colonic polyps   . History of colon polyps   . Liver lesion   . Obesity     PAST SURGICAL HISTORY :   Past Surgical History:  Procedure Laterality Date  . APPENDECTOMY    . HERNIA REPAIR     2015    FAMILY HISTORY :   Family History  Problem Relation Age of Onset  . Heart attack Mother   . CAD Father   . Hypertension Father   . Colon polyps Father        #/type unknown. Currently 63yo    SOCIAL HISTORY:   Social History   Tobacco Use  . Smoking status: Never Smoker  . Smokeless tobacco: Never Used  Substance Use Topics  . Alcohol use: Yes    Alcohol/week: 0.0 oz    Comment: occasionally  . Drug use: No    ALLERGIES:  has No Known Allergies.  MEDICATIONS:  Current Outpatient Medications  Medication Sig Dispense Refill  . ferrous sulfate 325 (65 FE) MG tablet Take 1 tablet (325 mg total) by mouth 2 (two) times daily with a meal. 60 tablet 3   No current facility-administered medications for this  visit.     PHYSICAL EXAMINATION:   BP 130/87 (BP Location: Left Arm, Patient Position: Sitting)   Pulse 93   Temp 98 F (36.7 C) (Tympanic)   Resp 16   Wt 262 lb (118.8 kg)   BMI 34.57 kg/m   Filed Weights   01/04/18 0854  Weight: 262 lb (118.8 kg)    GENERAL: Well-nourished well-developed; Alert, no distress and comfortable. His alone.  EYES: no pallor or icterus OROPHARYNX: no thrush or ulceration; good dentition  NECK: supple, no masses felt LYMPH:  no palpable lymphadenopathy in the cervical, axillary or inguinal regions LUNGS: clear to auscultation and  No wheeze or crackles HEART/CVS: regular rate & rhythm and no murmurs; No lower extremity edema ABDOMEN:abdomen soft, non-tender and normal bowel sounds Musculoskeletal:no cyanosis of digits and no clubbing  PSYCH: alert & oriented x 3 with fluent speech NEURO: no focal motor/sensory deficits SKIN:  no rashes or significant lesions  LABORATORY DATA:  I have reviewed the data as listed    Component Value Date/Time   NA 139 02/19/2016 0428   K 4.5 02/19/2016 0428   CL 109 02/19/2016 0428   CO2 24 02/19/2016 0428   GLUCOSE 126 (H) 02/19/2016 0428   BUN 11 02/19/2016 0428   CREATININE 0.71 02/19/2016  0428   CALCIUM 8.8 (L) 02/19/2016 0428   PROT 7.1 02/18/2016 1740   ALBUMIN 4.3 02/18/2016 1740   AST 16 02/18/2016 1740   ALT 13 (L) 02/18/2016 1740   ALKPHOS 52 02/18/2016 1740   BILITOT 0.8 02/18/2016 1740   GFRNONAA >60 02/19/2016 0428   GFRAA >60 02/19/2016 0428    No results found for: SPEP, UPEP  Lab Results  Component Value Date   WBC 6.2 02/27/2016   NEUTROABS 3.6 02/27/2016   HGB 10.1 (L) 02/27/2016   HCT 33.6 (L) 02/27/2016   MCV 64.1 (L) 02/27/2016   PLT 232 02/27/2016      Chemistry      Component Value Date/Time   NA 139 02/19/2016 0428   K 4.5 02/19/2016 0428   CL 109 02/19/2016 0428   CO2 24 02/19/2016 0428   BUN 11 02/19/2016 0428   CREATININE 0.71 02/19/2016 0428       Component Value Date/Time   CALCIUM 8.8 (L) 02/19/2016 0428   ALKPHOS 52 02/18/2016 1740   AST 16 02/18/2016 1740   ALT 13 (L) 02/18/2016 1740   BILITOT 0.8 02/18/2016 1740         ASSESSMENT & PLAN:   Symptomatic anemia # SEVERE IRON DEFICIENCY ANEMIA- of unclear etiology.  EGD/colonoscopy "polyps"; otherwise no obvious reason for his blood loss. Capsule study- NEG.    # Most recent lab work from [facility]-01/02/2018- hemoglobin 16.4/ ferritin 56; iron sat- 56%.  Improved.  # Multiple "polyps" [inflammatory] on colo/EGD- May 2017- ? Juvenile [grandma- dad/sister- stomach? Late; one sister- 33y; dad/mom/ uncles-aunts- no cancers].  Recommend genetic testing.  # Liver lesions- status post biopsy hemangiomas. No further follow-ups recommended; unless clinically necessary.  Follow up in 12 months/labs [facility].      Cammie Sickle, MD 01/17/2018 6:04 PM

## 2018-01-05 ENCOUNTER — Telehealth: Payer: Self-pay | Admitting: Genetic Counselor

## 2018-01-05 NOTE — Telephone Encounter (Addendum)
[facility] called to reschedule his genetic counseling appointment to Tuesday, 01/17/18 @ 11am  -------------------------------------------------------  [facility] called back to schedule a genetic counseling appointment: Tuesday, 01/10/18 @ 1pm.  -------------------------------------------------------  Dr. Rogue Bussing is referring Charles [facility] for genetic counseling due to a personal and family history of polyps. I left him a message to call and schedule this telegenetics visit to be done by phone at his convenience.   Steele Berg, [place], [place] Genetic Counselor Phone: [phone]

## 2018-01-16 ENCOUNTER — Encounter: Payer: Self-pay | Admitting: Internal Medicine

## 2018-01-17 ENCOUNTER — Telehealth: Payer: Self-pay | Admitting: Genetic Counselor

## 2018-01-17 ENCOUNTER — Encounter: Payer: Self-pay | Admitting: Genetic Counselor

## 2018-01-17 NOTE — Telephone Encounter (Signed)
Cancer Genetics            Telegenetics Initial Visit    Patient Name: Charles Jensen Patient DOB: 04/16/1981 Patient Age: 37 y.o. Phone Call Date: 01/17/2018  Referring Provider: Charlaine Dalton, MD  Reason for Visit: Evaluate for hereditary susceptibility to cancer    Assessment and Plan:  . Mr. Shankles history is not suggestive of a hereditary predisposition to cancer. We discussed that the type of polyps he had are not specific to any hereditary cancer predisposition.  . Testing was not recommended today, but is available to him if he wishes to get information about potential hereditary risk with the understanding that testing will likely be normal.    . Mr. Kerlin did not wish to pursue genetic testing today. He is encouraged to follow-up with his gastroenterologist and let me know if additional polyps are found on his next colonoscopy.  . Mr. Minion is encouraged to remain in contact with Cancer Genetics annually so that we can update the family history and inform him of any changes in cancer genetics and testing that may be of benefit for this family.     Dr. Grayland Ormond was available for questions concerning this case. Total time spent by counseling by phone was approximately 25 minutes.   _____________________________________________________________________   History of Present Illness: Mr. Charles Jensen, a 37 y.o. male, was referred for genetic counseling to discuss the possibility of a hereditary predisposition to cancer and discuss whether genetic testing is warranted. This was a telegenetics visit via phone.  Mr. Charles Jensen has no personal history of cancer. He had a colonoscopy in 04/2016 and was found to have 3 polyps in his rectum/sigmoid colon. All the polyps were found to be hyperplastic/inflammatory. He was told to return for another colonoscopy in 5 years.   Past Medical History:  Diagnosis Date  . Anemia   . Cough variant  asthma   . Family history of colonic polyps   . History of colon polyps   . Liver lesion   . Obesity     Past Surgical History:  Procedure Laterality Date  . APPENDECTOMY    . HERNIA REPAIR     2015    Family History: Significant diagnoses include the following:  Family History  Problem Relation Age of Onset  . Heart attack Mother   . CAD Father   . Hypertension Father   . Colon polyps Father        #/type unknown. Currently 1yo    Additionally, Mr. Charles Jensen has one son (age 17). He has one sister (age 65) who has a son and daughter.  His mother (age 68) has 62 sisters (ages 2s-60s). His father (age 23) reportedly had polyps, but Mr. Charles Jensen did not know the number or type(s) of polyps he had. His father had a brother and sister. There are no cancers reported in any of his grandparents.  Mr. Loadholt ancestry is Caucasian - NOS. There is no known Jewish ancestry and no consanguinity.  Discussion: We reviewed the characteristics, features and inheritance patterns of hereditary cancer syndromes with a focus on polyposis and the differences among polyp types. We discussed the process of genetic testing, insurance coverage and implications of results: positive, negative and variant of unknown significance (VUS).   Mr. Wenig questions were answered to his satisfaction today and he is welcome to call with any additional questions or concerns. Thank you for the  referral and allowing [place] to share in the care of your patient.    Steele Berg, MS, [place] Certified Genetic Counselor phone: [phone]

## 2018-01-18 ENCOUNTER — Ambulatory Visit: Payer: 59 | Admitting: Internal Medicine

## 2018-01-18 ENCOUNTER — Other Ambulatory Visit: Payer: 59

## 2018-11-26 ENCOUNTER — Ambulatory Visit
Admission: EM | Admit: 2018-11-26 | Discharge: 2018-11-26 | Disposition: A | Discharge status: Home / Self Care | Payer: 59 | Attending: Family Medicine | Admitting: Family Medicine

## 2018-11-26 ENCOUNTER — Encounter: Payer: Self-pay | Admitting: Emergency Medicine

## 2018-11-26 ENCOUNTER — Other Ambulatory Visit: Payer: Self-pay

## 2018-11-26 DIAGNOSIS — S161XXA Strain of muscle, fascia and tendon at neck level, initial encounter: Secondary | ICD-10-CM

## 2018-11-26 MED ORDER — CYCLOBENZAPRINE HCL 10 MG PO TABS: 10.0000 mg | ORAL_TABLET | Freq: Three times a day (TID) | ORAL | 0 refills | Status: DC | PRN

## 2018-11-26 MED ORDER — MELOXICAM 15 MG PO TABS: 15.0000 mg | ORAL_TABLET | Freq: Every day | ORAL | 0 refills | Status: DC

## 2018-11-26 NOTE — ED Provider Notes (Signed)
[facility]    CSN: [id number] Arrival date & time: 11/26/18  1037     History   Chief Complaint Chief Complaint  Patient presents with  . Neck Pain    HPI Charles Jensen is a 37 y.o. male.   37 yo male with a c/o neck pain and stiffness for 3 days. States he woke up Friday morning with symptoms. Denies any fevers, falls, or other traumatic injury. Denies numbness/tingling.   The history is provided by the patient.  Neck Pain    Past Medical History:  Diagnosis Date  . Anemia   . Cough variant asthma   . Family history of colonic polyps   . History of colon polyps   . Liver lesion   . Obesity     Patient Active Problem List   Diagnosis Date Noted  . Symptomatic anemia 02/18/2016  . Cough variant asthma 02/18/2016    Past Surgical History:  Procedure Laterality Date  . APPENDECTOMY    . HERNIA REPAIR     2015       Home Medications    Prior to Admission medications   Medication Sig Start Date End Date Taking? Authorizing Provider  ferrous sulfate 325 (65 FE) MG tablet Take 1 tablet (325 mg total) by mouth 2 (two) times daily with a meal. 02/19/16  Yes Sainani, Belia Heman, MD  rosuvastatin (CRESTOR) 5 MG tablet Take by mouth. 11/14/18 11/14/19 Yes [provider]  cyclobenzaprine (FLEXERIL) 10 MG tablet Take 1 tablet (10 mg total) by mouth 3 (three) times daily as needed for muscle spasms. 11/26/18   Norval Gable, MD  meloxicam (MOBIC) 15 MG tablet Take 1 tablet (15 mg total) by mouth daily. 11/26/18   Norval Gable, MD    Family History Family History  Problem Relation Age of Onset  . Heart attack Mother   . CAD Father   . Hypertension Father   . Colon polyps Father        #/type unknown. Currently 61yo    Social History Social History   Tobacco Use  . Smoking status: Never Smoker  . Smokeless tobacco: Never Used  Substance Use Topics  . Alcohol use: Yes    Alcohol/week: 0.0 standard drinks    Comment: occasionally    . Drug use: No     Allergies   Patient has no known allergies.   Review of Systems Review of Systems  Musculoskeletal: Positive for neck pain.     Physical Exam Triage Vital Signs ED Triage Vitals  Enc Vitals Group     BP 11/26/18 1049 (!) 143/101     Pulse Rate 11/26/18 1049 92     Resp 11/26/18 1049 16     Temp 11/26/18 1049 97.9 F (36.6 C)     Temp Source 11/26/18 1049 Oral     SpO2 11/26/18 1049 98 %     Weight 11/26/18 1046 260 lb (117.9 kg)     Height 11/26/18 1046 6\' 1"  (1.854 m)     Head Circumference --      Peak Flow --      Pain Score 11/26/18 1046 6     Pain Loc --      Pain Edu? --      Excl. in [place]? --    No data found.  Updated Vital Signs BP (!) 143/101 (BP Location: Left Arm)   Pulse 92   Temp 97.9 F (36.6 C) (Oral)   Resp 16  Ht 6\' 1"  (1.854 m)   Wt 117.9 kg   SpO2 98%   BMI 34.30 kg/m   Visual Acuity Right Eye Distance:   Left Eye Distance:   Bilateral Distance:    Right Eye Near:   Left Eye Near:    Bilateral Near:     Physical Exam  Constitutional: He appears well-developed and well-nourished. No distress.  Neck: Neck supple. No tracheal deviation present.  Pulmonary/Chest: Effort normal. No stridor. No respiratory distress.  Musculoskeletal:       Cervical back: He exhibits tenderness (over the cervical paraspinous muscles left) and spasm. He exhibits normal range of motion, no bony tenderness, no swelling, no edema, no deformity, no laceration, no pain and normal pulse.  Neurological: He is alert. He has normal reflexes. He displays normal reflexes. He exhibits normal muscle tone. Coordination normal.  Skin: No rash noted. He is not diaphoretic.  Nursing note and vitals reviewed.    UC Treatments / Results  Labs (all labs ordered are listed, but only abnormal results are displayed) Labs Reviewed - No data to display  EKG None  Radiology No results found.  Procedures Procedures (including critical care  time)  Medications Ordered in UC Medications - No data to display  Initial Impression / Assessment and Plan / UC Course  I have reviewed the triage vital signs and the nursing notes.  Pertinent labs & imaging results that were available during my care of the patient were reviewed by me and considered in my medical decision making (see chart for details).      Final Clinical Impressions(s) / UC Diagnoses   Final diagnoses:  Strain of neck muscle, initial encounter     Discharge Instructions     Gentle range of motion and massage; heat/ice    ED Prescriptions    Medication Sig Dispense Auth. Provider   cyclobenzaprine (FLEXERIL) 10 MG tablet Take 1 tablet (10 mg total) by mouth 3 (three) times daily as needed for muscle spasms. 30 tablet Norval Gable, MD   meloxicam (MOBIC) 15 MG tablet Take 1 tablet (15 mg total) by mouth daily. 30 tablet Norval Gable, MD      1. diagnosis reviewed with patient 2. rx as per orders above; reviewed possible side effects, interactions, risks and benefits  3. Recommend supportive treatment as above  4. Follow-up prn if symptoms worsen or don't improve   Controlled Substance Prescriptions [place] Controlled Substance Registry consulted? Not Applicable   Norval Gable, MD 11/26/18 [phone]

## 2018-11-26 NOTE — ED Triage Notes (Signed)
Patient c/o neck pain and stiffness that started when he woke up on Friday.

## 2018-11-26 NOTE — Discharge Instructions (Signed)
Gentle range of motion and massage; heat/ice

## 2019-01-02 ENCOUNTER — Encounter: Payer: Self-pay | Admitting: Internal Medicine

## 2019-01-03 ENCOUNTER — Inpatient Hospital Stay: Payer: 59 | Admitting: Internal Medicine

## 2019-01-25 ENCOUNTER — Other Ambulatory Visit: Payer: Self-pay

## 2019-01-25 ENCOUNTER — Inpatient Hospital Stay: Payer: 59 | Attending: Internal Medicine | Admitting: Internal Medicine

## 2019-01-25 ENCOUNTER — Encounter: Payer: Self-pay | Admitting: Internal Medicine

## 2019-01-25 VITALS — BP 141/90 | HR 87 | Temp 97.2°F | Resp 20 | Ht 73.0 in

## 2019-01-25 DIAGNOSIS — D1803 Hemangioma of intra-abdominal structures: Secondary | ICD-10-CM | POA: Insufficient documentation

## 2019-01-25 DIAGNOSIS — D509 Iron deficiency anemia, unspecified: Secondary | ICD-10-CM | POA: Insufficient documentation

## 2019-01-25 DIAGNOSIS — Z79899 Other long term (current) drug therapy: Secondary | ICD-10-CM | POA: Insufficient documentation

## 2019-01-25 DIAGNOSIS — D649 Anemia, unspecified: Secondary | ICD-10-CM

## 2019-01-25 NOTE — Assessment & Plan Note (Addendum)
#   SEVERE IRON DEFICIENCY ANEMIA- of unclear etiology.  EGD/colonoscopy "polyps"; otherwise no obvious reason for his blood loss. Capsule study- NEG. continue p.o. iron.  # Most recent lab work from [place]- hemoglobin 16.4/ ferritin 31; iron sat- 56%.  Improved.   # Multiple "polyps" [inflammatory] on colo/EGD- May 2017- ? Juvenile [grandma- dad/sister- stomach? Late; one sister- 33y; dad/mom/ uncles-aunts- no cancers].  Status post genetics evaluation.  # Liver lesions- status post biopsy hemangiomas. No further follow-ups recommended; unless clinically necessary.  #Discussed with patient patient is clinically stable; continue p.o. iron at this time no further follow-up unless patient is symptomatic.  He can call us for establishing care.  # DISPOSITION: # follow up as needed-Dr.B

## 2019-01-25 NOTE — Progress Notes (Signed)
[facility] OFFICE PROGRESS NOTE  Patient Care Team: Nathanial Millman, [place] as PCP - General (Family Medicine)   SUMMARY OF HEMATOLOGIC HISTORY:  # FEB 2017 SEVERE IRON DEFICIENCY ANEMIA [likley chronic] ? Etiology on IV iron [facility] 2017- EGD/COLO-multiple polpys/ but not clear source of bleeding; Dr.Rein]; s/p  Capsule study [jan 2018]  # FEB 2017 liver lesions [incidental] s/p Bx- hemagiomas  INTERVAL HISTORY:  A very pleasant 38 year-old male patient severe iron deficiency anemia status post IV iron- is currently up for follow-up.  Patient denies any any blood in stools or black stools. Fatigue no shortness of breath or cough.  Takes p.o. iron once a day most of the time.  Review of Systems  Constitutional: Negative for chills, diaphoresis, fever, malaise/fatigue and weight loss.  HENT: Negative for nosebleeds and sore throat.   Eyes: Negative for double vision.  Respiratory: Negative for cough, hemoptysis, sputum production, shortness of breath and wheezing.   Cardiovascular: Negative for chest pain, palpitations, orthopnea and leg swelling.  Gastrointestinal: Negative for abdominal pain, blood in stool, constipation, diarrhea, heartburn, melena, nausea and vomiting.  Genitourinary: Negative for dysuria, frequency and urgency.  Musculoskeletal: Negative for back pain and joint pain.  Skin: Negative.  Negative for itching and rash.  Neurological: Negative for dizziness, tingling, focal weakness, weakness and headaches.  Endo/Heme/Allergies: Does not bruise/bleed easily.  Psychiatric/Behavioral: Negative for depression. The patient is not nervous/anxious and does not have insomnia.      PAST MEDICAL HISTORY :  Past Medical History:  Diagnosis Date  . Anemia   . Cough variant asthma   . Family history of colonic polyps   . History of colon polyps   . Liver lesion   . Obesity     PAST SURGICAL HISTORY :   Past Surgical History:  Procedure Laterality  Date  . APPENDECTOMY    . HERNIA REPAIR     2015    FAMILY HISTORY :   Family History  Problem Relation Age of Onset  . Heart attack Mother   . CAD Father   . Hypertension Father   . Colon polyps Father        #/type unknown. Currently 78yo    SOCIAL HISTORY:   Social History   Tobacco Use  . Smoking status: Never Smoker  . Smokeless tobacco: Never Used  Substance Use Topics  . Alcohol use: Yes    Alcohol/week: 0.0 standard drinks    Comment: occasionally  . Drug use: No    ALLERGIES:  has No Known Allergies.  MEDICATIONS:  Current Outpatient Medications  Medication Sig Dispense Refill  . ferrous sulfate 325 (65 FE) MG tablet Take 1 tablet (325 mg total) by mouth 2 (two) times daily with a meal. 60 tablet 3  . rosuvastatin (CRESTOR) 5 MG tablet Take by mouth.     No current facility-administered medications for this visit.     PHYSICAL EXAMINATION:   BP (!) 141/90 (BP Location: Left Arm, Patient Position: Sitting)   Pulse 87   Temp (!) 97.2 F (36.2 C) (Tympanic)   Resp 20   Ht 6\' 1"  (1.854 m)   BMI 34.30 kg/m   There were no vitals filed for this visit.  Physical Exam  Constitutional: He is oriented to person, place, and time and well-developed, well-nourished, and in no distress.  HENT:  Head: Normocephalic and atraumatic.  Mouth/Throat: Oropharynx is clear and moist. No oropharyngeal exudate.  Eyes: Pupils are equal, round, and  reactive to light.  Neck: Normal range of motion. Neck supple.  Cardiovascular: Normal rate and regular rhythm.  Pulmonary/Chest: No respiratory distress. He has no wheezes.  Abdominal: Soft. Bowel sounds are normal. He exhibits no distension and no mass. There is no abdominal tenderness. There is no rebound and no guarding.  Musculoskeletal: Normal range of motion.        General: No tenderness or edema.  Neurological: He is alert and oriented to person, place, and time.  Skin: Skin is warm.  Psychiatric: Affect normal.      LABORATORY DATA:  I have reviewed the data as listed    Component Value Date/Time   NA 139 02/19/2016 0428   K 4.5 02/19/2016 0428   CL 109 02/19/2016 0428   CO2 24 02/19/2016 0428   GLUCOSE 126 (H) 02/19/2016 0428   BUN 11 02/19/2016 0428   CREATININE 0.71 02/19/2016 0428   CALCIUM 8.8 (L) 02/19/2016 0428   PROT 7.1 02/18/2016 1740   ALBUMIN 4.3 02/18/2016 1740   AST 16 02/18/2016 1740   ALT 13 (L) 02/18/2016 1740   ALKPHOS 52 02/18/2016 1740   BILITOT 0.8 02/18/2016 1740   GFRNONAA >60 02/19/2016 0428   GFRAA >60 02/19/2016 0428    No results found for: SPEP, UPEP  Lab Results  Component Value Date   WBC 6.2 02/27/2016   NEUTROABS 3.6 02/27/2016   HGB 10.1 (L) 02/27/2016   HCT 33.6 (L) 02/27/2016   MCV 64.1 (L) 02/27/2016   PLT 232 02/27/2016      Chemistry      Component Value Date/Time   NA 139 02/19/2016 0428   K 4.5 02/19/2016 0428   CL 109 02/19/2016 0428   CO2 24 02/19/2016 0428   BUN 11 02/19/2016 0428   CREATININE 0.71 02/19/2016 0428      Component Value Date/Time   CALCIUM 8.8 (L) 02/19/2016 0428   ALKPHOS 52 02/18/2016 1740   AST 16 02/18/2016 1740   ALT 13 (L) 02/18/2016 1740   BILITOT 0.8 02/18/2016 1740         ASSESSMENT & PLAN:   Symptomatic anemia # SEVERE IRON DEFICIENCY ANEMIA- of unclear etiology.  EGD/colonoscopy "polyps"; otherwise no obvious reason for his blood loss. Capsule study- NEG. continue p.o. iron.  # Most recent lab work from [place]- hemoglobin 16.4/ ferritin 31; iron sat- 56%.  Improved.   # Multiple "polyps" [inflammatory] on colo/EGD- May 2017- ? Juvenile [grandma- dad/sister- stomach? Late; one sister- 33y; dad/mom/ uncles-aunts- no cancers].  Status post medical evaluation.  # Liver lesions- status post biopsy hemangiomas. No further follow-ups recommended; unless clinically necessary.  #Discussed with patient patient is clinically stable; continue p.o. iron at this time no further  follow-up unless patient is symptomatic.  He can call us for establishing care.  # DISPOSITION: # follow up as needed-Dr.B     Cammie Sickle, MD 01/25/2019 8:02 PM

## 2020-01-25 ENCOUNTER — Ambulatory Visit: Payer: 59 | Admitting: Internal Medicine

## 2020-07-13 ENCOUNTER — Ambulatory Visit
Admission: EM | Admit: 2020-07-13 | Discharge: 2020-07-13 | Disposition: A | Discharge status: Home / Self Care | Payer: 59 | Attending: Family Medicine | Admitting: Family Medicine

## 2020-07-13 ENCOUNTER — Encounter: Payer: Self-pay | Admitting: Emergency Medicine

## 2020-07-13 ENCOUNTER — Ambulatory Visit (INDEPENDENT_AMBULATORY_CARE_PROVIDER_SITE_OTHER): Payer: 59

## 2020-07-13 ENCOUNTER — Other Ambulatory Visit: Payer: Self-pay

## 2020-07-13 DIAGNOSIS — S62637A Displaced fracture of distal phalanx of left little finger, initial encounter for closed fracture: Secondary | ICD-10-CM

## 2020-07-13 MED ORDER — MELOXICAM 15 MG PO TABS: 15.0000 mg | ORAL_TABLET | Freq: Every day | ORAL | 0 refills | Status: AC | PRN

## 2020-07-13 NOTE — ED Triage Notes (Signed)
Patient states that he jammed his finger between to blocks he was pulling yesterday.  Patient c/o pain and swelling in his left 5th finger.

## 2020-07-13 NOTE — Discharge Instructions (Signed)
Medication as prescribed.  Please call [facility] [phone]) OR [facility] [phone]) for an appt.  Take care  Dr. Lacinda Axon

## 2020-07-13 NOTE — ED Provider Notes (Signed)
[facility]    CSN: [id number] Arrival date & time: 07/13/20  1349  History   Chief Complaint Chief Complaint  Patient presents with  . Finger Injury    left 5th    HPI  39 year old male presents with the above complaint.  Patient reports that he jammed his finger between 2 concrete blocks yesterday.  Reports that he injured his left fifth finger.  Pain is predominantly distal to the PIP joint.  Mild swelling.  Pain 6/10 in severity.  He is able to flex and extend his finger.  He has taken some Advil without relief.  No other injuries.  No other complaints.  Past Medical History:  Diagnosis Date  . Anemia   . Cough variant asthma   . Family history of colonic polyps   . History of colon polyps   . Liver lesion   . Obesity    Patient Active Problem List   Diagnosis Date Noted  . Symptomatic anemia 02/18/2016  . Cough variant asthma 02/18/2016   Past Surgical History:  Procedure Laterality Date  . APPENDECTOMY    . HERNIA REPAIR     2015    Home Medications    Prior to Admission medications   Medication Sig Start Date End Date Taking? Authorizing Provider  ferrous sulfate 325 (65 FE) MG tablet Take 1 tablet (325 mg total) by mouth 2 (two) times daily with a meal. 02/19/16  Yes Sainani, Belia Heman, MD  phentermine 15 MG capsule Take by mouth. 06/19/20 07/19/20 Yes [provider]  rosuvastatin (CRESTOR) 5 MG tablet Take by mouth. 11/14/18 07/13/20 Yes [provider]  topiramate (TOPAMAX) 50 MG tablet Take by mouth. 06/19/20 06/19/21 Yes [provider]  meloxicam (MOBIC) 15 MG tablet Take 1 tablet (15 mg total) by mouth daily as needed for pain. 07/13/20   Coral Spikes, DO    Family History Family History  Problem Relation Age of Onset  . Heart attack Mother   . CAD Father   . Hypertension Father   . Colon polyps Father        #/type unknown. Currently 78yo    Social History Social History   Tobacco Use  . Smoking status: Never  Smoker  . Smokeless tobacco: Never Used  Vaping Use  . Vaping Use: Never used  Substance Use Topics  . Alcohol use: Yes    Alcohol/week: 0.0 standard drinks    Comment: occasionally  . Drug use: No     Allergies   Patient has no known allergies.   Review of Systems Review of Systems  Musculoskeletal:       Pain, left 5th finger.    Physical Exam Triage Vital Signs ED Triage Vitals  Enc Vitals Group     BP 07/13/20 1405 (!) 128/95     Pulse Rate 07/13/20 1405 93     Resp 07/13/20 1405 16     Temp 07/13/20 1405 98.2 F (36.8 C)     Temp Source 07/13/20 1405 Oral     SpO2 07/13/20 1405 98 %     Weight 07/13/20 1402 (!) 260 lb (117.9 kg)     Height 07/13/20 1402 6\' 1"  (1.854 m)     Head Circumference --      Peak Flow --      Pain Score 07/13/20 1402 6     Pain Loc --      Pain Edu? --      Excl. in  [place]? --    Updated Vital Signs BP (!) 128/95 (BP Location: Right Arm)   Pulse 93   Temp 98.2 F (36.8 C) (Oral)   Resp 16   Ht 6\' 1"  (1.854 m)   Wt (!) 117.9 kg   SpO2 98%   BMI 34.30 kg/m   Visual Acuity Right Eye Distance:   Left Eye Distance:   Bilateral Distance:    Right Eye Near:   Left Eye Near:    Bilateral Near:     Physical Exam Vitals and nursing note reviewed.  Constitutional:      General: He is not in acute distress.    Appearance: Normal appearance.  HENT:     Head: Normocephalic and atraumatic.  Pulmonary:     Effort: Pulmonary effort is normal. No respiratory distress.  Musculoskeletal:     Comments: Left 5th finger - Tenderness at the PIP and DIP joints. Mild swelling.   Neurological:     Mental Status: He is alert.  Psychiatric:        Mood and Affect: Mood normal.        Behavior: Behavior normal.    UC Treatments / Results  Labs (all labs ordered are listed, but only abnormal results are displayed) Labs Reviewed - No data to display  EKG   Radiology DG Finger Little Left  Result Date: 07/13/2020 CLINICAL DATA:  Pain  and swelling post blunt trauma EXAM: LEFT LITTLE FINGER 2+V COMPARISON:  None. FINDINGS: Oblique intra-articular fracture across the dorsal base of the distal phalanx fifth finger, distracted less than 1 mm. No significant subchondral cortical step-off deformity. Alignment is preserved. No other acute bone abnormality. Normal mineralization and alignment. No significant osseous degenerative change. IMPRESSION: Minimally displaced intra-articular fracture, dorsal base distal phalanx fifth finger. Electronically Signed   By: Lucrezia Europe M.D.   On: 07/13/2020 14:40    Procedures Procedures (including critical care time)  Medications Ordered in UC Medications - No data to display  Initial Impression / Assessment and Plan / UC Course  I have reviewed the triage vital signs and the nursing notes.  Pertinent labs & imaging results that were available during my care of the patient were reviewed by me and considered in my medical decision making (see chart for details).    39 year old male presents with a finger injury.  X-ray obtained and independently reviewed by me.  Interpretation: Intra-articular fracture of the base of the distal phalanx of the fifth finger.  Mild displacement.  Patient placed in splint.  Meloxicam as directed.  Advised follow-up with orthopedics.  Final Clinical Impressions(s) / UC Diagnoses   Final diagnoses:  Closed displaced fracture of distal phalanx of left little finger, initial encounter     Discharge Instructions     Medication as prescribed.  Please call [facility] [phone]) OR [facility] [phone]) for an appt.  Take care  Dr. Lacinda Axon    ED Prescriptions    Medication Sig Dispense Auth. Provider   meloxicam (MOBIC) 15 MG tablet Take 1 tablet (15 mg total) by mouth daily as needed for pain. 30 tablet Coral Spikes, DO     PDMP not reviewed this encounter.   Coral Spikes, [place] 07/13/20 2229

## 2020-08-09 ENCOUNTER — Other Ambulatory Visit: Payer: Self-pay | Admitting: Family Medicine

## 2021-03-17 IMAGING — CR DG FINGER LITTLE 2+V*L*
3 series · 3 of 3 positions shown · non-contrast
Comparison: None.

CLINICAL DATA: Pain and swelling post blunt trauma

EXAM:
LEFT LITTLE FINGER 2+V

[finger ap]
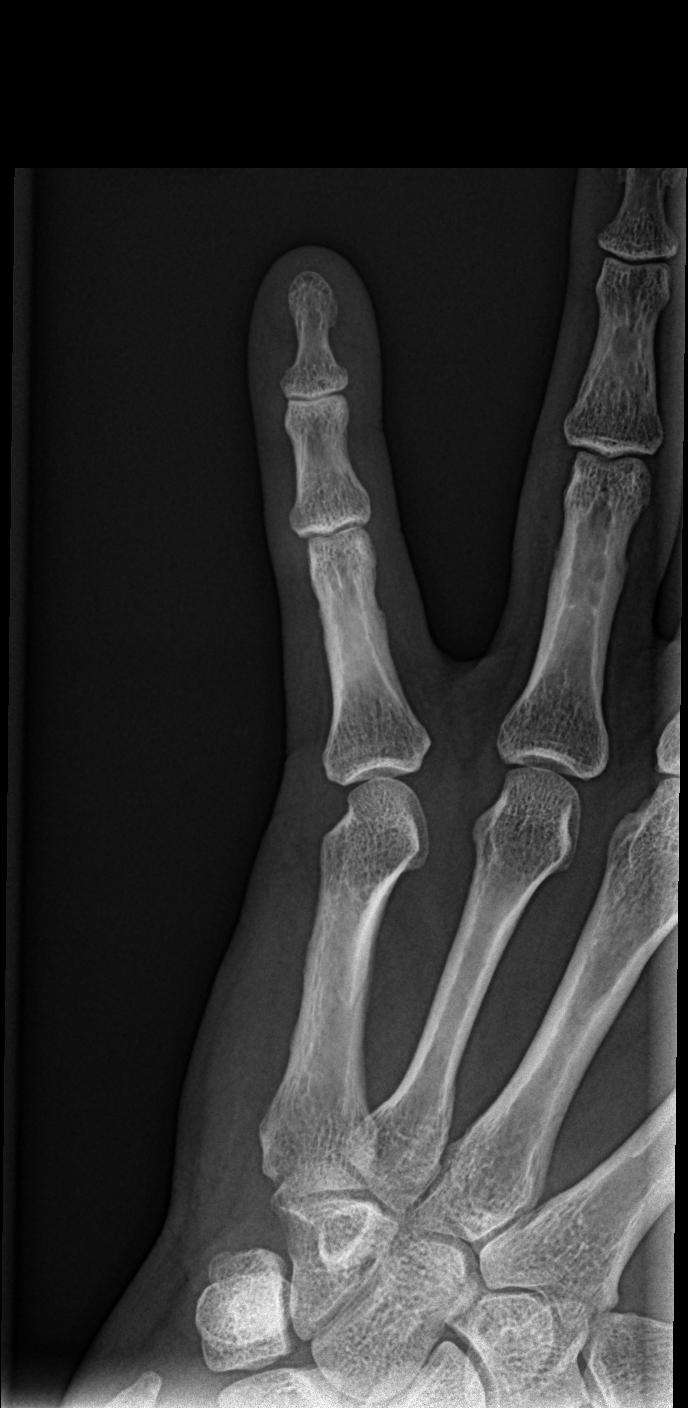

[finger obl]
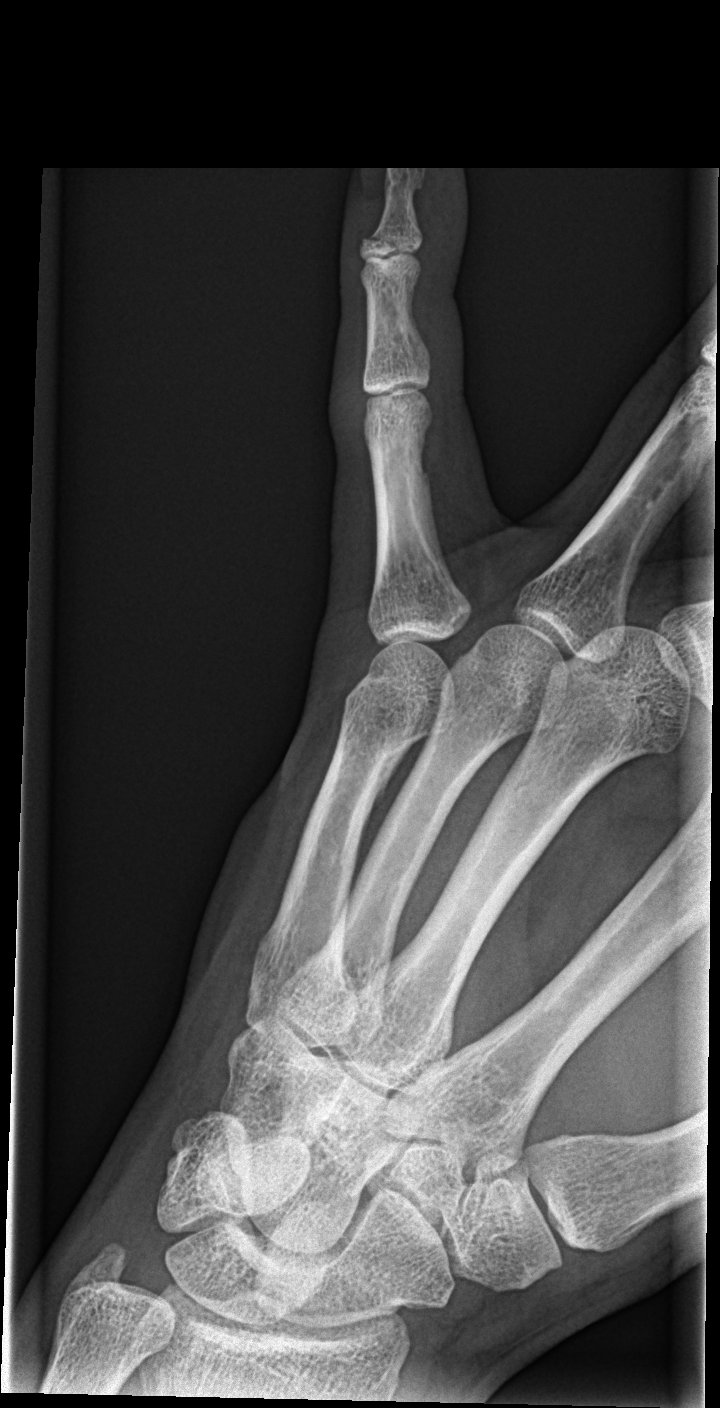

[finger lat]
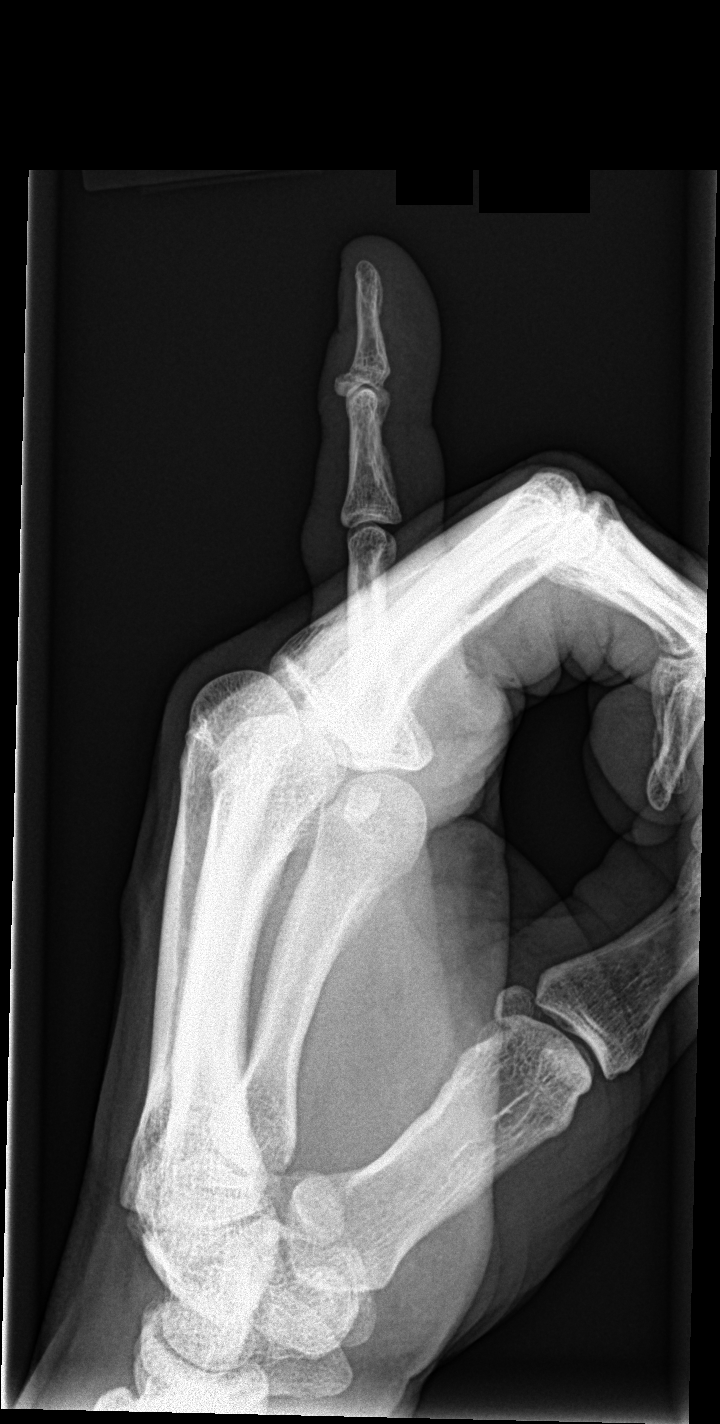

[3 of 3 positions shown; findings below may reference images not displayed]

FINDINGS: Oblique intra-articular fracture across the dorsal base of the
distal phalanx fifth finger, distracted less than 1 mm. No
significant subchondral cortical step-off deformity. Alignment is
preserved. No other acute bone abnormality. Normal mineralization
and alignment. No significant osseous degenerative change.
IMPRESSION: Minimally displaced intra-articular fracture, dorsal base distal
phalanx fifth finger.

## 2022-03-19 ENCOUNTER — Other Ambulatory Visit: Payer: Self-pay

## 2022-03-19 ENCOUNTER — Encounter: Payer: Self-pay | Admitting: Internal Medicine

## 2022-03-19 ENCOUNTER — Ambulatory Visit
Admission: EM | Admit: 2022-03-19 | Discharge: 2022-03-19 | Disposition: A | Discharge status: Home / Self Care | Payer: Managed Care, Other (non HMO) | Attending: Emergency Medicine | Admitting: Emergency Medicine

## 2022-03-19 ENCOUNTER — Encounter: Payer: Self-pay | Admitting: Emergency Medicine

## 2022-03-19 DIAGNOSIS — S61211A Laceration without foreign body of left index finger without damage to nail, initial encounter: Secondary | ICD-10-CM | POA: Diagnosis not present

## 2022-03-19 NOTE — ED Provider Notes (Signed)
?[place] ? ? ? ?CSN: [id number] ?Arrival date & time: 03/19/22  1910 ? ? ?  ? ?History   ?Chief Complaint ?Chief Complaint  ?Patient presents with  ? Extremity Laceration  ?  Left 2nd finger  ? ? ?HPI ?Charles Jensen is a 41 y.o. male.  ? ?Patient presents with laceration to the left index finger pad beginning today.  Endorses that he was cutting food with a knife while cooking and sliced a portion of his finger off.  Site is still bleeding.  Range of motion intact.  Denies numbness or tingling. ? ?Past Medical History:  ?Diagnosis Date  ? Anemia   ? Cough variant asthma   ? Family history of colonic polyps   ? History of colon polyps   ? Liver lesion   ? Obesity   ? ? ?Patient Active Problem List  ? Diagnosis Date Noted  ? Symptomatic anemia 02/18/2016  ? Cough variant asthma 02/18/2016  ? ? ?Past Surgical History:  ?Procedure Laterality Date  ? APPENDECTOMY    ? HERNIA REPAIR    ? 2015  ? ? ? ? ? ?Home Medications   ? ?Prior to Admission medications   ?Medication Sig Start Date End Date Taking? Authorizing Provider  ?ferrous sulfate 325 (65 FE) MG tablet Take 1 tablet (325 mg total) by mouth 2 (two) times daily with a meal. 02/19/16  Yes Sainani, Belia Heman, MD  ?phentermine 15 MG capsule Take by mouth. 06/19/20 03/19/22 Yes [provider]  ?rosuvastatin (CRESTOR) 5 MG tablet Take by mouth. 11/14/18 03/19/22 Yes [provider]  ?topiramate (TOPAMAX) 50 MG tablet Take by mouth. 06/19/20 03/19/22 Yes [provider]  ?meloxicam (MOBIC) 15 MG tablet Take 1 tablet (15 mg total) by mouth daily as needed for pain. 07/13/20   Coral Spikes, DO  ? ? ?Family History ?Family History  ?Problem Relation Age of Onset  ? Heart attack Mother   ? CAD Father   ? Hypertension Father   ? Colon polyps Father   ?     #/type unknown. Currently 41yo  ? ? ?Social History ?Social History  ? ?Tobacco Use  ? Smoking status: Never  ? Smokeless tobacco: Never  ?Vaping Use  ? Vaping Use: Never used  ?Substance  Use Topics  ? Alcohol use: Yes  ?  Alcohol/week: 0.0 standard drinks  ?  Comment: occasionally  ? Drug use: No  ? ? ? ?Allergies   ?Patient has no known allergies. ? ? ?Review of Systems ?Review of Systems  ?Constitutional: Negative.   ?Respiratory: Negative.    ?Cardiovascular: Negative.   ?Skin:  Positive for wound. Negative for color change, pallor and rash.  ?Neurological: Negative.   ? ? ?Physical Exam ?Triage Vital Signs ?ED Triage Vitals  ?Enc Vitals Group  ?   BP 03/19/22 1921 (!) 143/110  ?   Pulse Rate 03/19/22 1921 94  ?   Resp 03/19/22 1921 15  ?   Temp 03/19/22 1921 97.9 ?F (36.6 ?C)  ?   Temp Source 03/19/22 1921 Oral  ?   SpO2 03/19/22 1921 98 %  ?   Weight 03/19/22 1920 259 lb 14.8 oz (117.9 kg)  ?   Height 03/19/22 1920 '6\' 1"'$  (1.854 m)  ?   Head Circumference --   ?   Peak Flow --   ?   Pain Score 03/19/22 1920 7  ?   Pain Loc --   ?   Pain  Edu? --   ?   Excl. in [place]? --   ? ?No data found. ? ?Updated Vital Signs ?BP (!) 143/110 (BP Location: Right Arm)   Pulse 94   Temp 97.9 ?F (36.6 ?C) (Oral)   Resp 15   Ht '6\' 1"'$  (1.854 m)   Wt 259 lb 14.8 oz (117.9 kg)   SpO2 98%   BMI 34.29 kg/m?  ? ?Visual Acuity ?Right Eye Distance:   ?Left Eye Distance:   ?Bilateral Distance:   ? ?Right Eye Near:   ?Left Eye Near:    ?Bilateral Near:    ? ?Physical Exam ?Constitutional:   ?   Appearance: Normal appearance.  ?HENT:  ?   Head: Normocephalic.  ?Eyes:  ?   Extraocular Movements: Extraocular movements intact.  ?Pulmonary:  ?   Effort: Pulmonary effort is normal.  ?Skin: ?   Comments: 0.5 x 0.5 cm portion of the finger pad removed at the  proximal phalanx along the lateral aspect of the left index finger  ?Neurological:  ?   Mental Status: He is alert and oriented to person, place, and time. Mental status is at baseline.  ?Psychiatric:     ?   Mood and Affect: Mood normal.     ?   Behavior: Behavior normal.  ? ? ? ?UC Treatments / Results  ?Labs ?(all labs ordered are listed, but only abnormal results are  displayed) ?Labs Reviewed - No data to display ? ?EKG ? ? ?Radiology ?No results found. ? ?Procedures ?Laceration Repair ? ?Date/Time: 03/19/2022 7:50 PM ?Performed by: Hans Eden, NP ?Authorized by: Hans Eden, NP  ? ?Consent:  ?  Consent obtained:  Verbal ?  Consent given by:  Patient ?  Risks, benefits, and alternatives were discussed: yes   ?  Risks discussed:  Infection and poor cosmetic result ?Universal protocol:  ?  Procedure explained and questions answered to patient or proxy's satisfaction: yes   ?  Patient identity confirmed:  Verbally with patient ?Anesthesia:  ?  Anesthesia method:  Local infiltration ?  Local anesthetic:  Lidocaine 1% w/o epi ?Laceration details:  ?  Location:  Finger ?  Finger location:  L index finger ?  Length (cm):  0.5 ?  Depth (mm):  0.5 ?Pre-procedure details:  ?  Preparation:  Patient was prepped and draped in usual sterile fashion ?Exploration:  ?  Hemostasis achieved with:  Cautery and tourniquet ?  Wound exploration: entire depth of wound visualized   ?Treatment:  ?  Wound cleansed with: wound cleanser. ?Repair type:  ?  Repair type:  Simple ?Post-procedure details:  ?  Dressing:  Non-adherent dressing ?  Procedure completion:  Tolerated (including critical care time) ? ?Medications Ordered in UC ?Medications - No data to display ? ?Initial Impression / Assessment and Plan / UC Course  ?I have reviewed the triage vital signs and the nursing notes. ? ?Pertinent labs & imaging results that were available during my care of the patient were reviewed by me and considered in my medical decision making (see chart for details). ? ?Laceration of left index finger without foreign body without damage to nail, initial encounter ? ?Hemostasis achieved by cauterization, once cauterized site has been closed as a part of the pad has been removed, sensation is intact, range of motion intact, no involvement of the joint where the nail, advised daily cleansing with normal hygiene  nonadherent applied Ace risk for contamination, Patient would like to use Tylenol or  ibuprofen for management of discomfort, given strict precautions to watch for signs of infection and to follow-up with urgent care.,   ?Final Clinical Impressions(s) / UC Diagnoses  ? ?Final diagnoses:  ?Laceration of left index finger without foreign body without damage to nail, initial encounter  ? ? ? ?Discharge Instructions   ? ?  ?The area to your finger has been sealed shut by burning, the body should continue to heal with site without any further intervention ? ?You may cleanse daily with your normal hygiene, pat dry and cover if there is potential for contamination ? ?While there is a low risk it is no potential risks as there is an opening in your skin, please watch for signs of infection such as increased swelling, increased redness, increased pain, fever, chills or drainage, if this occurs at any point please go to the nearest urgent care for evaluation ? ? ?ED Prescriptions   ?None ?  ? ?PDMP not reviewed this encounter. ?  ?Hans Eden, NP ?03/19/22 1953 ? ?

## 2022-03-19 NOTE — ED Triage Notes (Signed)
Patient states that he cut his left 2nd finger with a clean knife about 30-40 min ago.  Patient reports pain in his left 2nd finger tip.  ?

## 2022-03-19 NOTE — Discharge Instructions (Signed)
The area to your finger has been sealed shut by burning, the body should continue to heal with site without any further intervention ? ?You may cleanse daily with your normal hygiene, pat dry and cover if there is potential for contamination ? ?While there is a low risk it is no potential risks as there is an opening in your skin, please watch for signs of infection such as increased swelling, increased redness, increased pain, fever, chills or drainage, if this occurs at any point please go to the nearest urgent care for evaluation ?

## 2025-01-23 ENCOUNTER — Encounter (HOSPITAL_COMMUNITY): Payer: Self-pay | Admitting: Emergency Medicine

## 2025-01-23 ENCOUNTER — Encounter

## 2025-01-24 DIAGNOSIS — E1165 Type 2 diabetes mellitus with hyperglycemia: Secondary | ICD-10-CM | POA: Diagnosis present

## 2025-01-24 DIAGNOSIS — R739 Hyperglycemia, unspecified: Secondary | ICD-10-CM | POA: Diagnosis present
